# Patient Record
Sex: Male | Born: 1935
Health system: Southern US, Community
[De-identification: ages and names within clinical notes are randomized; demographics above are authoritative.]

## PROBLEM LIST (undated history)

## (undated) DIAGNOSIS — M4800 Spinal stenosis, site unspecified: Secondary | ICD-10-CM

## (undated) DIAGNOSIS — H9190 Unspecified hearing loss, unspecified ear: Secondary | ICD-10-CM

## (undated) DIAGNOSIS — M791 Myalgia, unspecified site: Secondary | ICD-10-CM

## (undated) DIAGNOSIS — E559 Vitamin D deficiency, unspecified: Secondary | ICD-10-CM

## (undated) DIAGNOSIS — N289 Disorder of kidney and ureter, unspecified: Secondary | ICD-10-CM

## (undated) DIAGNOSIS — R000 Tachycardia, unspecified: Secondary | ICD-10-CM

## (undated) DIAGNOSIS — E7800 Pure hypercholesterolemia, unspecified: Secondary | ICD-10-CM

## (undated) DIAGNOSIS — M1990 Unspecified osteoarthritis, unspecified site: Secondary | ICD-10-CM

## (undated) DIAGNOSIS — I3139 Other pericardial effusion (noninflammatory): Secondary | ICD-10-CM

## (undated) DIAGNOSIS — I10 Essential (primary) hypertension: Secondary | ICD-10-CM

## (undated) DIAGNOSIS — I2510 Atherosclerotic heart disease of native coronary artery without angina pectoris: Secondary | ICD-10-CM

## (undated) DIAGNOSIS — I4891 Unspecified atrial fibrillation: Secondary | ICD-10-CM

## (undated) DIAGNOSIS — J309 Allergic rhinitis, unspecified: Secondary | ICD-10-CM

## (undated) DIAGNOSIS — J90 Pleural effusion, not elsewhere classified: Secondary | ICD-10-CM

## (undated) DIAGNOSIS — E871 Hypo-osmolality and hyponatremia: Secondary | ICD-10-CM

## (undated) DIAGNOSIS — I313 Pericardial effusion (noninflammatory): Secondary | ICD-10-CM

## (undated) DIAGNOSIS — H6120 Impacted cerumen, unspecified ear: Secondary | ICD-10-CM

## (undated) DIAGNOSIS — I493 Ventricular premature depolarization: Secondary | ICD-10-CM

## (undated) DIAGNOSIS — I4510 Unspecified right bundle-branch block: Secondary | ICD-10-CM

## (undated) HISTORY — DX: Vitamin D deficiency, unspecified: E55.9

## (undated) HISTORY — DX: Pleural effusion, not elsewhere classified: J90

## (undated) HISTORY — DX: Spinal stenosis, site unspecified: M48.00

## (undated) HISTORY — DX: Pure hypercholesterolemia, unspecified: E78.00

## (undated) HISTORY — PX: CATARACT EXTRACTION: SUR2

## (undated) HISTORY — DX: Impacted cerumen, unspecified ear: H61.20

## (undated) HISTORY — DX: Tachycardia, unspecified: R00.0

## (undated) HISTORY — DX: Myalgia, unspecified site: M79.10

## (undated) HISTORY — PX: JOINT REPLACEMENT: SHX530

## (undated) HISTORY — DX: Unspecified right bundle-branch block: I45.10

## (undated) HISTORY — DX: Ventricular premature depolarization: I49.3

## (undated) HISTORY — PX: CARDIAC CATHETERIZATION: SHX172

## (undated) HISTORY — DX: Hypo-osmolality and hyponatremia: E87.1

## (undated) HISTORY — PX: HERNIA REPAIR: SHX51

## (undated) HISTORY — DX: Unspecified atrial fibrillation: I48.91

## (undated) HISTORY — DX: Other pericardial effusion (noninflammatory): I31.39

## (undated) HISTORY — PX: INNER EAR SURGERY: SHX679

## (undated) HISTORY — DX: Pericardial effusion (noninflammatory): I31.3

## (undated) HISTORY — DX: Disorder of kidney and ureter, unspecified: N28.9

## (undated) HISTORY — DX: Allergic rhinitis, unspecified: J30.9

---

## 1999-04-24 ENCOUNTER — Encounter: Payer: Self-pay | Admitting: Emergency Medicine

## 1999-04-24 ENCOUNTER — Inpatient Hospital Stay (HOSPITAL_COMMUNITY): Admission: EM | Admit: 1999-04-24 | Discharge: 1999-04-26 | Payer: Self-pay | Admitting: Emergency Medicine

## 2000-02-17 ENCOUNTER — Ambulatory Visit (HOSPITAL_COMMUNITY): Admission: RE | Admit: 2000-02-17 | Discharge: 2000-02-17 | Payer: Self-pay | Admitting: Gastroenterology

## 2001-01-02 ENCOUNTER — Encounter: Payer: Self-pay | Admitting: Orthopedic Surgery

## 2001-01-02 ENCOUNTER — Ambulatory Visit: Admission: RE | Admit: 2001-01-02 | Discharge: 2001-01-02 | Payer: Self-pay | Admitting: Orthopedic Surgery

## 2004-04-19 ENCOUNTER — Inpatient Hospital Stay (HOSPITAL_COMMUNITY): Admission: RE | Admit: 2004-04-19 | Discharge: 2004-04-23 | Payer: Self-pay | Admitting: Orthopedic Surgery

## 2004-07-19 ENCOUNTER — Encounter: Admission: RE | Admit: 2004-07-19 | Discharge: 2004-07-19 | Payer: Self-pay | Admitting: Orthopedic Surgery

## 2005-01-18 ENCOUNTER — Encounter: Admission: RE | Admit: 2005-01-18 | Discharge: 2005-01-18 | Payer: Self-pay | Admitting: Orthopedic Surgery

## 2007-10-12 ENCOUNTER — Encounter: Admission: RE | Admit: 2007-10-12 | Discharge: 2007-10-12 | Payer: Self-pay | Admitting: Internal Medicine

## 2007-11-08 ENCOUNTER — Encounter: Admission: RE | Admit: 2007-11-08 | Discharge: 2007-11-08 | Payer: Self-pay | Admitting: Internal Medicine

## 2007-11-22 ENCOUNTER — Encounter: Admission: RE | Admit: 2007-11-22 | Discharge: 2007-11-22 | Payer: Self-pay | Admitting: Internal Medicine

## 2009-03-02 ENCOUNTER — Inpatient Hospital Stay (HOSPITAL_COMMUNITY): Admission: RE | Admit: 2009-03-02 | Discharge: 2009-03-05 | Payer: Self-pay | Admitting: Orthopedic Surgery

## 2010-06-21 LAB — CBC
Platelets: 152 10*3/uL (ref 150–400)
RDW: 14.1 % (ref 11.5–15.5)

## 2010-06-21 LAB — BASIC METABOLIC PANEL
BUN: 14 mg/dL (ref 6–23)
CO2: 28 mEq/L (ref 19–32)
Calcium: 8.2 mg/dL — ABNORMAL LOW (ref 8.4–10.5)
Chloride: 100 mEq/L (ref 96–112)
Creatinine, Ser: 1.13 mg/dL (ref 0.4–1.5)
GFR calc non Af Amer: >60 mL/min (ref >60)
Glucose, Bld: 95 mg/dL (ref 70–99)
Potassium: 4.1 mEq/L (ref 3.5–5.1)

## 2010-06-21 LAB — PROTIME-INR
INR: 1.11 (ref 0.00–1.49)
Prothrombin Time: 14.2 seconds (ref 11.6–15.2)

## 2010-06-22 LAB — BASIC METABOLIC PANEL
BUN: 14 mg/dL (ref 6–23)
Calcium: 8.2 mg/dL — ABNORMAL LOW (ref 8.4–10.5)
Chloride: 97 mEq/L (ref 96–112)
Creatinine, Ser: 0.96 mg/dL (ref 0.4–1.5)
Creatinine, Ser: 1.1 mg/dL (ref 0.4–1.5)
GFR calc non Af Amer: >60 mL/min (ref >60)
Glucose, Bld: 120 mg/dL — ABNORMAL HIGH (ref 70–99)
Potassium: 4.2 mEq/L (ref 3.5–5.1)
Sodium: 131 mEq/L — ABNORMAL LOW (ref 135–145)

## 2010-06-22 LAB — CBC
HCT: 30.3 % — ABNORMAL LOW (ref 39.0–52.0)
HCT: 40.5 % (ref 39.0–52.0)
Hemoglobin: 10.1 g/dL — ABNORMAL LOW (ref 13.0–17.0)
MCHC: 33.9 g/dL (ref 30.0–36.0)
MCV: 101.8 fL — ABNORMAL HIGH (ref 78.0–100.0)
Platelets: 137 10*3/uL — ABNORMAL LOW (ref 150–400)
Platelets: 182 10*3/uL (ref 150–400)
RBC: 2.98 MIL/uL — ABNORMAL LOW (ref 4.22–5.81)
RDW: 14.2 % (ref 11.5–15.5)

## 2010-06-22 LAB — COMPREHENSIVE METABOLIC PANEL
AST: 29 U/L (ref 0–37)
CO2: 30 mEq/L (ref 19–32)
Chloride: 98 mEq/L (ref 96–112)
Creatinine, Ser: 1.17 mg/dL (ref 0.4–1.5)

## 2010-06-22 LAB — URINALYSIS, ROUTINE W REFLEX MICROSCOPIC
Bilirubin Urine: NEGATIVE
Hgb urine dipstick: NEGATIVE
Protein, ur: NEGATIVE mg/dL
Specific Gravity, Urine: 1.016 (ref 1.005–1.030)
Urobilinogen, UA: 0.2 mg/dL (ref 0.0–1.0)

## 2010-06-22 LAB — PROTIME-INR
INR: 0.87 (ref 0.00–1.49)
INR: 1.02 (ref 0.00–1.49)
INR: 1.03 (ref 0.00–1.49)
INR: 1.06 (ref 0.00–1.49)
Prothrombin Time: 13.3 seconds (ref 11.6–15.2)
Prothrombin Time: 13.4 seconds (ref 11.6–15.2)
Prothrombin Time: 13.7 seconds (ref 11.6–15.2)

## 2010-06-22 LAB — APTT: aPTT: 42 seconds — ABNORMAL HIGH (ref 24–37)

## 2010-06-22 LAB — TYPE AND SCREEN
ABO/RH(D): O POS
Antibody Screen: NEGATIVE

## 2010-08-06 NOTE — Cardiovascular Report (Signed)
[place]  Patient:    [name], [name]                    MRN: Proc. Date: 04/26/99 Attending:  Colleen Can. Deborah Chalk, M.D. CC:         Cardiac Catheterization Laboratory             Lum Babe, M.D.                        Cardiac Catheterization  PROCEDURE:  Left heart catheterization with selective coronary angiography, left ventricular angiography with Perclose.  CARDIOLOGIST:  Colleen Can. Deborah Chalk, M.D.  INDICATIONS:  Mr. Brumbaugh is a 77 year old male who presented with atypical symptoms of pallor and nausea, but had positive troponins.  He has a right bundle branch block.  He is referred for a cardiac catheterization.  TYPE AND SITE OF ENTRY:  Percutaneous right femoral artery.  CATHETERS:  A 6-French #4-curved Judkins right and left coronary catheters, a 6-French pigtail ventriculographic catheter.  CONTRAST MATERIAL:  Omnipaque.  MEDICATIONS GIVEN PRIOR TO THE PROCEDURE:  Valium 10 mg p.o.  MEDICATIONS GIVEN DURING THE PROCEDURE:  Versed 2 mg IV.  COMMENTS:  The patient tolerated the procedure well.  ANGIOGRAPHIC DATA: 1. Left main coronary artery:  Is short and large and normal. 2. Left anterior descending coronary artery:  The left anterior descending    bifurcates early with a high diagonal vessel.  After the diagonal, there    appears to be a 40%-50% narrowing at the proximal left anterior descending    and somewhat hazy, and could represent irregular plaque, but in multiple    views it is not felt to be a significant obstructive lesion.  The diagonal    vessel has 20%-30% irregularities.  The left anterior descending coronary    artery wraps around the apex. 3. Left circumflex coronary artery:  The left circumflex is a moderately-    large system.  It continues primarily as an obtuse marginal branch.  It    is free of significant disease. 4. Right coronary artery:  The right coronary artery is a dominant vessel.  There are mild irregularities distally.  LEFT VENTRICULAR ANGIOGRAM:  The left ventricular angiogram is performed in the RAO position.  Overall cardiac size and silhouette are normal.  The left ventricular function is normal.  OVERAlL IMPRESSION: 1. Normal left ventricular function. 2. Mild coronary atherosclerosis with 40%-50% proximal left anterior descending    coronary artery disease and mild disease in the high diagonal vessel.  DISCUSSION:  It is felt that the patient does not need acute intervention at this time but does need to have aggressive management of risk factors, and one cannot exclude the possibility of a ruptured plaque or resolving thrombus.  Aggressive  management and followup nuclear study would be recommended.  DD:  04/26/99 TD:  04/26/99 Job: [id number]

## 2010-08-06 NOTE — Op Note (Signed)
[name], [name]           ACCOUNT NO.:  [id number]   MEDICAL RECORD NO.:  [id number]          PATIENT TYPE:  INP   LOCATION:  0005                         FACILITY:  [facility]   PHYSICIAN:  Ollen Gross, M.D.    DATE OF BIRTH:  1927/03/02   DATE OF PROCEDURE:  04/19/2004  DATE OF DISCHARGE:                                 OPERATIVE REPORT   PREOPERATIVE DIAGNOSIS:  Osteoarthritis, right hip.   POSTOPERATIVE DIAGNOSIS:  Osteoarthritis, right hip.   PROCEDURE:  Right total hip arthroplasty, metal on metal.   SURGEON:  Ollen Gross, M.D.   ASSISTANT:  Alexzandrew L. Julien Girt, P.A.   ANESTHESIA:  Spinal.   ESTIMATED BLOOD LOSS:  400.   DRAINS:  Hemovac x1.   COMPLICATIONS:  None.   CONDITION:  Stable to recovery.   CLINICAL NOTE:  [name] is a 77 year old male with severe end-stage  arthritis of the right hip with intractable pain.  He has failed  nonoperative management and presents now for total hip arthroplasty.   PROCEDURE IN DETAIL:  After successful administration of spinal anesthetic,  the patient was placed in the left lateral decubitus position with his right  side up and held with the hip positioner.  Right lower extremity was  isolated from his perineum with plastic drapes and prepped and draped in the  usual sterile fashion.  A small posterolateral incision is made with a 10  blade through the subcutaneous tissue to the level of the fascia lata which  is incised in line with the skin incision.  Sciatic nerve was palpated and  protected and the short rotator was isolated off the femur.  Capsulectomy  was performed and the hip was dislocated.  The center of the femoral head is  marked and a trial prosthesis placed such that the center of the trial head  corresponds with the center of his native femoral head.  Osteotomy line is  marked on the femoral neck and osteotomy with an oscillating saw.  The femur  was then retracted anteriorly to gain  acetabular exposure.  The acetabular  labrum and a large rim of osteophytes are removed.  Acetabular reaming  starts at 47 coursing in increments of 2-55 and then a 56-mm Pinnacle  acetabular shell is placed in anatomic position and transfixed with two dome  screws.  Trial of 36-mm neutral liner is placed.   The femur is prepared first with a canal finder and then irrigation.  Axial  reaming is performed up to 17.5 mm.  Proximal reaming to a 22-F and the  sleeve machined to a large.  A 22-F large sleeve is placed with a 22 x 17  stem and a 36+8 neck matching his native anteversion.  A 36+0 trial head is  placed and the hip is reduced.  He had excellent stability with full  extension, full external rotation, 70 degrees of flexion, 40 degrees  adduction, 90 degrees internal rotation, and 90 degrees flexion and 90  degrees internal rotation.  The hip is then dislocated and the trial is  removed.  The permanent apex hole eliminator was placed into  the acetabular  shell.  Permanent 36-mm neutral Ultramet metal liner is placed.  It is a  metal-on-metal hip replacement.  The 22-F large sleep is placed with a 22 x  17 stem, 36+8 neck matching his native anteversion.  A 36+0 head is placed  and the hip is reduced to the same stability parameters.  By placing the  right leg on top of the left, the leg lengths are found to be equal.  The  wound is then copiously irrigated with saline solution and a short rotator  is reattached to the femur through drill holes.  Fascia lata is closed over  a Hemovac drain with interrupted #1 Vicryl, subcu closed with #1 and 2-0  Vicryl, and subcuticular with a running 4-0 Monocryl.  The incision is  cleaned and dried and Steri-Strips and a bulky sterile dressing applied.  He  is then awakened and transported to recovery in stable condition.      FA/MEDQ  D:  04/19/2004  T:  04/19/2004  Job:  [id number]

## 2010-08-06 NOTE — Op Note (Signed)
[place]  Patient:    [name]                     MRN: [id number] Proc. Date: 04/24/99 Adm. Date:  [id number] Attending:  Lum Babe CC:         Lum Babe, M.D.             Demetria Pore. Coral Spikes, M.D.                           Operative Report  The patient is a 77 year old right-handed white male chief complaint, "I got clammy and nauseous" admitted with sweating, nausea, clamminess, ? etiology. Initially rule out cardiac, ? other.  Clammy, nausea, and sweat, ? etiology.  About one hour ago at dinner with friends, the patient had his main meal.  He then went to the kitchen and got something else to eat and sat down and then became clammy and nauseated for 15 minutes and felt sweaty, no chest pain.  Dr. Glennon Hamilton, who was at the dinner party, said he looked very pale.  He had no fainting, shortness of breath, chest pain, arm or neck pain.  He had a solid bowel movement but then became slightly loose.  He was brought to the emergency room.  His symptoms resolved before coming to the emergency room.  In the emergency room, temperature is 97, blood pressure 126/69, pulse 63, respiratory rate 20.  EKG: Left anterior hemiblock and right bundle branch block. He was seen by Dr. Ignacia Palma, and I was called to admit to rule out MI.  The patient has not smoked for 40 years.  There is hypertension.  No history of  coronary artery disease.  No history of ulcer disease or pancreatic disease. On Celebrex for hip pain.  No palpitations.  PAST MEDICAL HISTORY:  1. ALLERGIES: INDOCIN and VOLTAREN - rash.  2. MEDICATIONS:    a. Zestril 5 mg a day.    b. Hytrin 10 mg a day.    c. Celebrex 200 mg a day.  IMMUNIZATIONS:  Flu shot, 2000, ? has not had pneumovax shot.  SURGERY: 1. Status post collapsed right ear drum surgery November 2000.  He has tube in    right ear on exam. 2. Left inguinal hernia repair. 3. Status post BCE right  ear.  INJURY: Fracture of right hip with pin that was removed.  He has right hip pain, uses Celebrex.  MEDICAL: 1. 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 2. Status post impetigo. 3. Hypertension. 4. EKG: Right bundle branch block, left anterior hemiblock, December 2000.    No fainting. 5. History of PAC and PVC on Holter. 6. Status post [facility] spotted fever followed by pneumonia. 7. Bifocal glasses.  FAMILY AND SOCIAL HISTORY:  Widowed, lives with a woman now.  He does not smoke, drinks alcohol socially.  Works [employer] part time.   Family history of heart disease over the age of 15.  REVIEW OF SYSTEMS:  The patient has had a headache today, but this has resolved. All other systems negative.  PHYSICAL EXAMINATION:  GENERAL:  Alert and in no acute distress.  VITAL SIGNS:  Temperature 97, blood pressure 126/69, respiratory rate 20, pulse 63.  SKIN:  Not sweaty and no rash.  HEENT:  Eyes: Conjunctivae pink, sclerae white.  Right pupil slightly irregular,  both react to light.  Fundi grossly normal.  ? cataract right eye.  ENT unremarkable.  NECK:  Supple.  Thyroid not enlarged.  No bruit, no adenopathy.  LUNGS:  Clear to percussion and auscultation.  HEART:  Regular rhythm without murmur, gallop, or rub.  No chest wall pain.  ABDOMEN:  Benign, without organomegaly or bruit.  GENITALIA:  Normal male.  RECTAL:  Normal.  Prostate trace to 1+ enlarged.  Stool brown, negative for blood.  EXTREMITIES:  No edema.  Pulses intact.  NEUROLOGIC:  No focal neurological findings.  JOINT:  No synovitis.  DATABASE:  Chest x-ray: No active disease.  EKG: Normal sinus rhythm with left axis deviation and right bundle branch block.  CBC, CMET, CPK-MB, troponin, PT, and PTT pending.  IMPRESSION:  Sweating, nausea, and clammy, ? etiology, rule out cardiac, rule out gastrointestinal, ? other.  PLAN: 1. Admit.   Discuss with patient. 2. Serial enzymes and EKG.  If negative, stress test ?. 3. Monitor for arrhythmia. 4. Lovenox and aspirin. 5. Hypertension.  Continue Hytrin and Zestril. 6. See order sheet for details. DD:  04/24/99 TD:  04/24/99 Job: [id number]

## 2010-08-06 NOTE — Discharge Summary (Signed)
[name], [name]           ACCOUNT NO.:  [id number]   MEDICAL RECORD NO.:  [id number]          PATIENT TYPE:  INP   LOCATION:  0454                         FACILITY[facility]   PHYSICIAN:  Ollen Gross, M.D.    DATE OF BIRTH:  [birth year]   DATE OF ADMISSION:  04/19/2004  DATE OF DISCHARGE:  04/23/2004                                 DISCHARGE SUMMARY   ADMISSION DIAGNOSES:  1.  Osteoarthritis, right hip.  2.  Mild coronary arterial disease.  3.  Hypertension.  4.  Hyperlipidemia.  5.  Allergic rhinitis.  6.  Chronic right bundle branch block with left anterior hemiblock.  7.  Benign prostatic hypertrophy.   DISCHARGE DIAGNOSES:  1.  Osteoarthritis, right hip, status post right total hip arthroplasty.  2.  Mild postoperative blood loss anemia.  3.  Hyponatremia.  4.  Mild coronary artery disease.  5.  Hypertension.  6.  Hyperlipidemia.  7.  Chronic right bundle branch block with left anterior hemiblock.  8.  Benign prostatic hypertrophy.   PROCEDURE:  On April 19, 2004, right total hip arthroplasty with a metal-  on-metal construct.   SURGEON:  Ollen Gross, M.D.   ASSISTANT:  Alexzandrew L. Perkins, P.A.-C.   ANESTHESIA:  Spinal.   ESTIMATED BLOOD LOSS:  400 mL.   DRAINS:  Hemovac x1.   CONSULTATIONS:  Internal medicine, Dr. Johnella Moloney.   BRIEF HISTORY:  Mr. Sollenberger is a 77 year old male with a severe end-stage  arthritis of the right hip with intractable pain.  He has been on  nonoperative management, now presents for a total hip arthroplasty.   LABORATORY DATA:  Preoperative CBC showed a hemoglobin of 14.4, hematocrit  42.4.  Differential within normal limits.  Postoperative hemoglobin 10.9,  came back up to 11.1.  Last H&H was at 10.3 and 29.8.  PT/PTT preoperatively  12 and 43 respectively.  INR 0.8.  Serial pro times are followed. PT/INR  16.8 and 1.5.  Chem panel on admission.  Lower sodium of 132, low chloride  9.3, elevated BUN 29.   Slightly elevated total bilirubin of 1.4.  Remaining  chem panel all within normal limits.  Serum BMETs were followed.  Sodium did  drop down to 120, back up to 128, last noted at 126.  Chloride went down to  88, back up to a normal level of 96.  Glucose went from 87 to 126, back down  to 93.  BUN came down to a normal level from 29.  Last noted to have 12.  Calcium dropped from 9.5 to 7.5, back up to 8.  Urinalysis preoperatively  negative.  Followup urinalysis large blood.  Small leukocyte esterase, only  3-6 white cells but 21-50 red cells.   X-rays:  Pelvis and hip films taken on April 19, 2004, status post right  total hip replacement.  Normal alignment.  Preoperative right hip films on  April 14, 2004 showed severe osteoarthritis, right hip with large cyst  seen in the right femoral head.   HOSPITAL COURSE:  The patient admitted to [facility] and underwent  above procedure without complications.  He tolerated the procedure well,  later returned to recovery room and then orthopedic floor.  Hemovac drain  was placed at the time of surgery, was pulled on day #1.  Was noted  postoperatively to have moderate hyponatremia.  Sodium was repeated.  He was  placed on fluid restrictions, I&Os.  On his I&Os he was noted to be  overloaded but he did have a preoperative BUN which was elevated.  His BUN  had to come back down to a normal level of 22 on day #1.  His creatinine was  also 1.1.  He had fairly good output from that midnight after surgery.  He  was seen later on that day by Dr. Kevan Ny.  His fluids were changed.  It was  noted that his blood pressure was under good control.   By day #2, he was doing better.  He was felt to be a little dehydrated  preoperatively.  Therefore, he did respond to the fluids.  Despite the fact  that he looked positive on his I&Os, his BUN returned to a normal level  after the fluid resuscitation during the surgery.  He started to get up out  of  bed, started ambulating by day #2.  Dressing was changed on day #2.  Incision was healing well.  By day #3, he continued to progress and was  ambulating approximately 80-100 feet.  Hemoglobin was noted to be lower to  10.3 but it was asymptomatic with this and did require transfusion.  He  continued to progress well.  By the following day of April 23, 2004, he  was tolerating medications. Had been weaned over to p.o. medications,  progressing well with physical therapy and it was decided the patient would  be discharged home.   PLAN:  1.  The patient discharged home on April 23, 2004.  2.  Discharge diagnoses:  Please see above.   DISCHARGE MEDICATIONS:  Coumadin, Robaxin, Vicodin.   DIET:  Resume previous home diet.   FOLLOW UP:  Two weeks from surgery.  Call the office for an appointment at  [phone].   DISPOSITION:  Home.   ACTIVITY:  Partial weightbearing 25-50%, right lower extremity.  Home  physical therapy with home health nursing.  May start showering. Total hip  precautions at all times.   CONDITION ON DISCHARGE:  Improved.      ALP/MEDQ  D:  05/26/2004  T:  05/26/2004  Job:  [id number]   cc:   Candyce Churn, M.D.  [address] [place]  [place] [address]  Fax: [phone]

## 2010-08-06 NOTE — H&P (Signed)
[name], [name]           ACCOUNT NO.:  [id number]   MEDICAL RECORD NO.:  [id number]          PATIENT TYPE:  INP   LOCATION:  NA                           FACILITY:  [facility]   PHYSICIAN:  Ollen Gross, M.D.    DATE OF BIRTH:  [birth year]   DATE OF ADMISSION:  04/19/2004  DATE OF DISCHARGE:                                HISTORY & PHYSICAL   CHIEF COMPLAINT:  Right hip pain.   HISTORY OF PRESENT ILLNESS:  The patient is a 77 year old male who has been  seen by Dr. Lequita Halt for ongoing right hip pain.  He was seen back in 2002  and was noted to have some significant arthritic changes in the right hip.  It has progressively gotten worse since that time with significant changes  over the past six months to a year.  The pain is in the groin and lateral  thigh and anterior thigh.  It is worse with activity but he does have some  pain at rest.  He has difficulty getting comfortable at night.  He is at a  stage now where he would like to have something done about it.  It has had a  negative impact on his lifestyle.  He is seen in the office where x-rays  show severe end-stage arthritis of the right hip with bone-on-bone  throughout and some large marginal osteophytes.  The risks and benefits of  the procedure have been discussed with the patient.  It is felt to be the  appropriate candidate.  He is subsequently admitted to the hospital.   ALLERGIES:  Indomethacin, Voltaren, Relafen, ibuprofen and a food allergy to  shell fish.   CURRENT MEDICATIONS:  1.  Lipitor 10 mg.  2.  Terazosin 10 mg.  3.  Diovan/hydrochlorothiazide 160/25 mg.  4.  Norvasc 2.5 mg.  5.  Celebrex 200 mg, stopped.  6.  Baby aspirin, stopped.   PAST MEDICAL HISTORY:  1.  Mild coronary arterial disease.  2.  Hypertension.  3.  Hyperlipidemia.  4.  Allergic rhinitis.  5.  Chronic right bundle branch block with a left anterior hemiblock.  6.  Benign prostatic hypertrophy.  7.  Past history of a syncopal  episode back in March of 2005 secondary to      dehydration.   SOCIAL HISTORY:  1.  Right hip pinning secondary to a fracture in 1968 with subsequent pin      removal.  2.  Hernia repair in 1968.  3.  Eye surgery in the 90's.  4.  Cardiac catheterization about four to five years ago.   SOCIAL HISTORY:  Married, retired, nonsmoker.  His wife will be assisting  with care after surgery.   FAMILY HISTORY:  Father deceased with a history of stroke and prostate  cancer.  Family history is also is significant for heart disease,  hypertension and diabetes.   REVIEW OF SYSTEMS:  GENERAL:  No fevers, chills, night sweats.  NEUROLOGICAL:  No seizure, syncope or paralysis.  RESPIRATORY:  No shortness  of breath, productive cough or hemoptysis.  CARDIOVASCULAR:  No chest pain,  angina or  orthopnea although he does have a history of some mild coronary  arterial disease and hypertension.  GASTROINTESTINAL:  No nausea and  vomiting, diarrhea or constipation.  GENITOURINARY:  No dysuria, hematuria.  He does have some nocturia but no discharge.  MUSCULOSKELETAL:  Right hip as  in the history of present illness.   PHYSICAL EXAMINATION:  VITAL SIGNS:  Pulse 68, respirations 12, blood  pressure 110/72.  GENERAL:  A 77 year old white male, thin-framed, well-nourished, well-  nourished in no acute distress.  He is alert and oriented and cooperative,  very pleasant.  He is accompanied by his wife.  HEENT:  Normocephalic, atraumatic.  Pupils are equal, round and reactive.  EOM's are intact.  He is noted to wear glasses.  NECK:  Supple, no bruits.  CHEST:  Clear anterior and posterior chest wall.  No rhonchi, rubs or  wheezing.  HEART:  A regular rhythm.  No murmurs.  S1 and S2 noted.  ABDOMEN:  Soft, nontender.  Bowel sounds present.  RECTAL/BREASTS/GENITALIA:  Not done, not pertinent to present illness.  EXTREMITIES:  Right hip shows range of motion flexion of 100 degrees,  internal rotation to 5,  external rotation of 20, abduction to 20.  He does  ambulate with an antalgic gait.  Motor function is intact.   IMPRESSION:  1.  Osteoarthritis, right hip.  2.  Mild coronary arterial disease.  3.  Hypertension.  4.  Hyperlipidemia.  5.  Allergic rhinitis.  6.  Chronic right bundle branch block with left anterior hemiblock.  7.  Benign prostatic hypertrophy.   PLAN:  The patient will be admitted to [facility] to undergo right  total hip arthroplasty.  The surgery will be performed by Dr. Ollen Gross.  Dr. Johnella Moloney in the patient's physician.  He will be notified of the  room number and admission and will be consulted if needed for any medical  assistance with the patient throughout the hospital course.     ALP/MEDQ  D:  04/18/2004  T:  04/18/2004  Job:  [id number]   cc:   Candyce Churn, M.D.  [address] [place]  [place] [address]  Fax: [phone]

## 2010-08-06 NOTE — Consult Note (Signed)
[place]  Patient:    [name]                     MRN: [id number] Proc. Date: 04/25/99 Adm. Date:  [id number] Attending:  Lum Babe Dictator:   Colleen Can. Deborah Chalk, M.D. CC:         Lum Babe, M.D.                          Consultation Report  Thank you very much for asking me to see [name].  He is a 77 year old ale admitted with an episode of diaphoresis and nausea after a meal on 04/24/99.  His  first troponin-I was 0.09, but now his second troponin-I is 0.24.  His EKG shows right bundle branch block.  He has not had any recurrent symptoms, but is admitted. He had no chest pain syndrome.  PAST MEDICAL HISTORY: 1. History of hypertension. 2. History of premature atrial contractions. 3. History of premature ventricular contractions. 4. Right bundle branch block first noted in November 2000. 5. He has degenerative joint disease of the hip. 6. Bladder outlet obstruction. 7. History of basal cell carcinoma. 8. History of ear surgery in November 2000. 9. History of hernia surgery.  CURRENT MEDICATIONS: 1. Celebrex, he is off of that here. 2. Lovenox 75 mg q.12h. 3. Ecotrin. 4. Zestril 5 mg q.d. 5. Hytrin 10 mg q.d.  ALLERGIES:  INDOCIN AND VOLTAREN.  FAMILY HISTORY:  Father died at age 79 of a cerebrovascular accident.  Mother died at age 11 of a cerebrovascular accident.  One brother died in his 60s of leukemia.  SOCIAL HISTORY:  He is a widower over the past couple of years.  He is semi-retired from [employer].  He exercises daily and swims.  A nonsmoker, social alcohol.  REVIEW OF SYSTEMS:  He had the episode last night lasting about 15 minutes.  It  occurred after eating.  He felt better after having a bowel movement.  No prior  episodes.  Dr. Glennon Hamilton was present, and insisted that he come to the emergency room.  He did not have any chest discomfort.  PHYSICAL  EXAMINATION:  GENERAL:  He is a pleasant white male in no acute distress.  VITAL SIGNS:  Blood pressure 158/88, heart rate 70, respiratory rate 60.  SKIN:  Warm and dry, color is normal.  LUNGS:  Clear.  HEART:  Regular rate and rhythm.  ABDOMEN:  Soft, nontender.  EXTREMITIES:  Without edema.  Good peripheral pulses.  NEUROLOGIC:  He is intact.  LABORATORY DATA:  His white blood cell count is 10,800, hematocrit 42, platelet  count is 129,000.  Potassium is 5.6, BUN 35, creatinine 1.2, glucose 106. CK-MBs remained negative, but with positive troponins after having a negative initial troponin.  His EKG shows a right bundle branch block.  OVERALL IMPRESSION: 1. Episode of diaphoresis, pallor, nausea, with subsequent increased troponin. 2. Hypertension. 3. Right bundle branch block. 4. History of premature atrial contractions, premature ventricular contractions.  PLAN:  In light of the increased troponin and symptoms, we will proceed on with  cardiac catheterization.  Procedure, risks, and benefits have been explained. Risks, including heart attack, stroke, heart stopping, allergy, infection, bleeding, have all been explained.  The patient is willing to proceed. DD:  04/25/99 TD:  04/25/99 Job: [id number]

## 2010-08-06 NOTE — Procedures (Signed)
[facility]  Patient:    [name], [name]                    MRN: [id number] Proc. Date: 02/17/00 Attending:  Verlin Grills, M.D. CC:         Pearla Dubonnet, M.D.   Procedure Report  PROCEDURES:  Surveillance colonoscopy.  PROCEDURE INDICATIONS:  Mr. Jaydien Panepinto (date of birth [birth year]) is a 77 year old male who is due for surveillance colonoscopy to prevent colon cancer.  I discussed with Mr. Jansson the complications associated with colonoscopy and polypectomy, including a 1:1000 risk of bleeding and for 4:1000 risk of colon rupture requiring surgery.  Mr. Deakin has signed the operative permit.  ENDOSCOPIST:  Verlin Grills, M.D.  PREMEDICATION:  Demerol 30 mg and Versed 5 mg.  ENDOSCOPE:  Olympus pediatric colonoscope.  DESCRIPTION OF PROCEDURE:  After obtaining informed consent, the patient was placed in the left lateral decubitus position.  I administered intravenous Demerol and intravenous Versed to achieve conscious sedation for the procedure.  The patients blood pressure, oxygen saturation, and cardiac rhythm were monitored throughout the procedure and documented in the medical record.  Anal inspection was normal.  Digital rectal exam revealed a non-nodular, but enlarged prostate.  The Olympus pediatric video colonoscope was introduced into the rectum and under direct vision advanced to the cecum as identified by normal-appearing ileocecal valve.  Colonic preparation for the exam today was excellent.  Rectal normal.  Sigmoid colon and descending colon normal.  Splenic flexure normal.  Transverse colon normal.  Hepatic flexure normal.  Ascending colon normal.  Cecum and ileocecal valve normal.  ASSESSMENT:  Normal proctocolonoscopy to the cecum.  No endoscopic evidence for the presence of colorectal neoplasia.  RECOMMENDATIONS:  Repeat colonoscopy in 5-10 years. DD:  02/17/00 TD:   02/17/00 Job: [id number]

## 2010-08-06 NOTE — Discharge Summary (Signed)
[facility]  Patient:    [name]                     MRN: [id number] Adm. Date:  [id number] Disc. Date: [id number] Attending:  Lum Babe CC:         Colleen Can. Deborah Chalk, M.D.                           Discharge Summary  A 77 year old white male admitted after an episode of nausea and becoming clammy while at dinner.  He underwent cardiac catheterization by Dr. Roger Shelter. The catheterization revealed normal left ventricular function, a 40 to 50% proximal LAD lesion.  This area was hazy, and the question of irregular plaque/clot was raised. The diagonal vessel had 28 to 30% irregularities.  There was occlusion of an obtuse marginal branch not noted on the initial review.  The patient was discharged from the hospital on April 26, 1999.  LABORATORY DATA:  Hemoglobin 14.9 g percent, hematocrit 42.9%, white count 10,800 which subsequently fell to 4000.  Platelets were low running from 117,000 to 131,000.  Initial differential showed 89 polys, 4 lymphs, 4 monocytes, 2 eosinophil, no basophils, 1 large unidentified cell.  PT and PTT were normal on  admission.  Sodium 133, potassium 5.6 on a hemolyzed specimen.  Chloride 102, CO2 24, glucose 106, BUN 35, creatinine 1.2.  Repeat sodium 137, potassium 4.0, chloride 107, CO2 28, BUN 14, creatinine 1.0.  AST was 41 with normal to 37. Bilirubin was 1.5, normal to 1.0.  CK: First 139, second 94, MB 3.9.  Troponin  was 0.09 which at that time was a minimally elevated value.  Repeat troponin was 0.24 which is indeed elevated, but additional CKs were 90, 71, with MB of 3.2 and 2.2.  Electrocardiogram showed sinus rhythm with left axis deviation, right bundle branch block.  COURSE IN THE HOSPITAL:  The patient was admitted to rule out infarction.  He was seen in consultation by Dr. Deborah Chalk.  With the elevated troponin I but normal CKs, it was elected to go ahead and do cardiac  catheterization with findings indicated above.  After the procedure, it was elected to follow the patient clinically. he possibility of ruptured plaque of resolving thrombus was considered.  Plans were made for outpatient nuclear study.  He was discharged from the hospital on April 26, 1999.  DISCHARGE DIAGNOSES: 1. Coronary artery disease, mild. 2. Hypertension.  DISCHARGE MEDICATIONS: 1. Plavix 75 mg 1 a day for a month. 2. Imdur 30 mg 1 a day for a month. 3. Aspirin 325 mg 1 daily. 4. Zestril 5 mg a day. 5. Hytrin 10 mg a day. 6. Celebrex 200 mg 1 a day.  ACTIVITY:  Temporary restriction on swimming until nuclear study is done.  DIET:  Low fat.  WOUND CARE:  Not applicable.  SPECIAL INSTRUCTIONS:  Leave bandage on until the next day, plain Neosporin for two days.  May bathe the day after dressing is removed.  FOLLOWUP:  Follow up with physician in 10 days.  CONDITION ON DISCHARGE:  Improved. DD:  05/21/99 TD:  05/22/99 Job: [id number]

## 2011-04-13 DIAGNOSIS — N433 Hydrocele, unspecified: Secondary | ICD-10-CM | POA: Diagnosis not present

## 2011-04-13 DIAGNOSIS — N401 Enlarged prostate with lower urinary tract symptoms: Secondary | ICD-10-CM | POA: Diagnosis not present

## 2011-04-13 DIAGNOSIS — N509 Disorder of male genital organs, unspecified: Secondary | ICD-10-CM | POA: Diagnosis not present

## 2011-05-19 DIAGNOSIS — R002 Palpitations: Secondary | ICD-10-CM | POA: Diagnosis not present

## 2011-06-10 DIAGNOSIS — M169 Osteoarthritis of hip, unspecified: Secondary | ICD-10-CM | POA: Diagnosis not present

## 2011-06-10 DIAGNOSIS — M1610 Unilateral primary osteoarthritis, unspecified hip: Secondary | ICD-10-CM | POA: Diagnosis not present

## 2011-06-13 DIAGNOSIS — E782 Mixed hyperlipidemia: Secondary | ICD-10-CM | POA: Diagnosis not present

## 2011-06-13 DIAGNOSIS — R002 Palpitations: Secondary | ICD-10-CM | POA: Diagnosis not present

## 2011-06-13 DIAGNOSIS — M169 Osteoarthritis of hip, unspecified: Secondary | ICD-10-CM | POA: Diagnosis not present

## 2011-06-13 DIAGNOSIS — M1610 Unilateral primary osteoarthritis, unspecified hip: Secondary | ICD-10-CM | POA: Diagnosis not present

## 2011-07-11 DIAGNOSIS — R002 Palpitations: Secondary | ICD-10-CM | POA: Diagnosis not present

## 2011-08-24 DIAGNOSIS — L570 Actinic keratosis: Secondary | ICD-10-CM | POA: Diagnosis not present

## 2011-09-26 DIAGNOSIS — Z79899 Other long term (current) drug therapy: Secondary | ICD-10-CM | POA: Diagnosis not present

## 2011-09-26 DIAGNOSIS — E782 Mixed hyperlipidemia: Secondary | ICD-10-CM | POA: Diagnosis not present

## 2011-09-26 DIAGNOSIS — Z0000 Encounter for general adult medical examination without abnormal findings: Secondary | ICD-10-CM | POA: Diagnosis not present

## 2011-09-26 DIAGNOSIS — I10 Essential (primary) hypertension: Secondary | ICD-10-CM | POA: Diagnosis not present

## 2011-09-26 DIAGNOSIS — Z125 Encounter for screening for malignant neoplasm of prostate: Secondary | ICD-10-CM | POA: Diagnosis not present

## 2011-09-27 DIAGNOSIS — Z1331 Encounter for screening for depression: Secondary | ICD-10-CM | POA: Diagnosis not present

## 2011-09-27 DIAGNOSIS — M1610 Unilateral primary osteoarthritis, unspecified hip: Secondary | ICD-10-CM | POA: Diagnosis not present

## 2011-09-27 DIAGNOSIS — M169 Osteoarthritis of hip, unspecified: Secondary | ICD-10-CM | POA: Diagnosis not present

## 2011-09-27 DIAGNOSIS — E559 Vitamin D deficiency, unspecified: Secondary | ICD-10-CM | POA: Diagnosis not present

## 2011-09-27 DIAGNOSIS — G4700 Insomnia, unspecified: Secondary | ICD-10-CM | POA: Diagnosis not present

## 2011-09-27 DIAGNOSIS — N400 Enlarged prostate without lower urinary tract symptoms: Secondary | ICD-10-CM | POA: Diagnosis not present

## 2011-09-27 DIAGNOSIS — E782 Mixed hyperlipidemia: Secondary | ICD-10-CM | POA: Diagnosis not present

## 2011-09-27 DIAGNOSIS — I10 Essential (primary) hypertension: Secondary | ICD-10-CM | POA: Diagnosis not present

## 2011-09-27 DIAGNOSIS — Z0000 Encounter for general adult medical examination without abnormal findings: Secondary | ICD-10-CM | POA: Diagnosis not present

## 2011-10-05 DIAGNOSIS — L570 Actinic keratosis: Secondary | ICD-10-CM | POA: Diagnosis not present

## 2011-10-12 DIAGNOSIS — N183 Chronic kidney disease, stage 3 unspecified: Secondary | ICD-10-CM | POA: Diagnosis not present

## 2011-11-25 DIAGNOSIS — L570 Actinic keratosis: Secondary | ICD-10-CM | POA: Diagnosis not present

## 2011-12-12 DIAGNOSIS — Z23 Encounter for immunization: Secondary | ICD-10-CM | POA: Diagnosis not present

## 2011-12-12 DIAGNOSIS — L570 Actinic keratosis: Secondary | ICD-10-CM | POA: Diagnosis not present

## 2012-01-05 DIAGNOSIS — Z961 Presence of intraocular lens: Secondary | ICD-10-CM | POA: Diagnosis not present

## 2012-02-02 DIAGNOSIS — M1610 Unilateral primary osteoarthritis, unspecified hip: Secondary | ICD-10-CM | POA: Diagnosis not present

## 2012-02-02 DIAGNOSIS — M76899 Other specified enthesopathies of unspecified lower limb, excluding foot: Secondary | ICD-10-CM | POA: Diagnosis not present

## 2012-02-02 DIAGNOSIS — M169 Osteoarthritis of hip, unspecified: Secondary | ICD-10-CM | POA: Diagnosis not present

## 2012-02-03 ENCOUNTER — Other Ambulatory Visit (HOSPITAL_COMMUNITY): Payer: Self-pay | Admitting: Orthopedic Surgery

## 2012-02-03 DIAGNOSIS — M25552 Pain in left hip: Secondary | ICD-10-CM

## 2012-02-03 DIAGNOSIS — M25551 Pain in right hip: Secondary | ICD-10-CM

## 2012-02-06 DIAGNOSIS — B353 Tinea pedis: Secondary | ICD-10-CM | POA: Diagnosis not present

## 2012-02-06 DIAGNOSIS — L820 Inflamed seborrheic keratosis: Secondary | ICD-10-CM | POA: Diagnosis not present

## 2012-02-06 DIAGNOSIS — D485 Neoplasm of uncertain behavior of skin: Secondary | ICD-10-CM | POA: Diagnosis not present

## 2012-02-29 DIAGNOSIS — H7419 Adhesive middle ear disease, unspecified ear: Secondary | ICD-10-CM | POA: Diagnosis not present

## 2012-02-29 DIAGNOSIS — H903 Sensorineural hearing loss, bilateral: Secondary | ICD-10-CM | POA: Diagnosis not present

## 2012-02-29 DIAGNOSIS — H6980 Other specified disorders of Eustachian tube, unspecified ear: Secondary | ICD-10-CM | POA: Diagnosis not present

## 2012-02-29 DIAGNOSIS — H905 Unspecified sensorineural hearing loss: Secondary | ICD-10-CM | POA: Diagnosis not present

## 2012-03-01 ENCOUNTER — Encounter (HOSPITAL_COMMUNITY)
Admission: RE | Admit: 2012-03-01 | Discharge: 2012-03-01 | Disposition: A | Discharge status: Home / Self Care | Payer: Medicare Other | Source: Ambulatory Visit | Attending: Orthopedic Surgery | Admitting: Orthopedic Surgery

## 2012-03-01 ENCOUNTER — Ambulatory Visit (HOSPITAL_COMMUNITY)
Admission: RE | Admit: 2012-03-01 | Discharge: 2012-03-01 | Disposition: A | Discharge status: Home / Self Care | Payer: Medicare Other | Source: Ambulatory Visit | Attending: Orthopedic Surgery | Admitting: Orthopedic Surgery

## 2012-03-01 DIAGNOSIS — M25559 Pain in unspecified hip: Secondary | ICD-10-CM | POA: Diagnosis not present

## 2012-03-01 DIAGNOSIS — Z96649 Presence of unspecified artificial hip joint: Secondary | ICD-10-CM | POA: Diagnosis not present

## 2012-03-01 DIAGNOSIS — M25552 Pain in left hip: Secondary | ICD-10-CM

## 2012-03-01 DIAGNOSIS — M25551 Pain in right hip: Secondary | ICD-10-CM

## 2012-03-01 MED ORDER — TECHNETIUM TC 99M MEDRONATE IV KIT
24.0000 | PACK | Freq: Once | INTRAVENOUS | Status: AC | PRN
  Administered 2012-03-01: 24 via INTRAVENOUS

## 2012-04-05 DIAGNOSIS — M1610 Unilateral primary osteoarthritis, unspecified hip: Secondary | ICD-10-CM | POA: Diagnosis not present

## 2012-04-05 DIAGNOSIS — M169 Osteoarthritis of hip, unspecified: Secondary | ICD-10-CM | POA: Diagnosis not present

## 2012-04-11 DIAGNOSIS — E559 Vitamin D deficiency, unspecified: Secondary | ICD-10-CM | POA: Diagnosis not present

## 2012-04-11 DIAGNOSIS — I10 Essential (primary) hypertension: Secondary | ICD-10-CM | POA: Diagnosis not present

## 2012-04-11 DIAGNOSIS — M169 Osteoarthritis of hip, unspecified: Secondary | ICD-10-CM | POA: Diagnosis not present

## 2012-04-11 DIAGNOSIS — N400 Enlarged prostate without lower urinary tract symptoms: Secondary | ICD-10-CM | POA: Diagnosis not present

## 2012-04-11 DIAGNOSIS — G4700 Insomnia, unspecified: Secondary | ICD-10-CM | POA: Diagnosis not present

## 2012-04-11 DIAGNOSIS — M1610 Unilateral primary osteoarthritis, unspecified hip: Secondary | ICD-10-CM | POA: Diagnosis not present

## 2012-04-11 DIAGNOSIS — R6889 Other general symptoms and signs: Secondary | ICD-10-CM | POA: Diagnosis not present

## 2012-04-11 DIAGNOSIS — N183 Chronic kidney disease, stage 3 unspecified: Secondary | ICD-10-CM | POA: Diagnosis not present

## 2012-04-11 DIAGNOSIS — E782 Mixed hyperlipidemia: Secondary | ICD-10-CM | POA: Diagnosis not present

## 2012-04-13 DIAGNOSIS — N401 Enlarged prostate with lower urinary tract symptoms: Secondary | ICD-10-CM | POA: Diagnosis not present

## 2012-05-14 DIAGNOSIS — R0609 Other forms of dyspnea: Secondary | ICD-10-CM | POA: Diagnosis not present

## 2012-05-14 DIAGNOSIS — I498 Other specified cardiac arrhythmias: Secondary | ICD-10-CM | POA: Diagnosis not present

## 2012-05-14 DIAGNOSIS — I452 Bifascicular block: Secondary | ICD-10-CM | POA: Diagnosis not present

## 2012-05-14 DIAGNOSIS — R0989 Other specified symptoms and signs involving the circulatory and respiratory systems: Secondary | ICD-10-CM | POA: Diagnosis not present

## 2012-05-14 DIAGNOSIS — R5381 Other malaise: Secondary | ICD-10-CM | POA: Diagnosis not present

## 2012-05-14 DIAGNOSIS — R5383 Other fatigue: Secondary | ICD-10-CM | POA: Diagnosis not present

## 2012-05-14 DIAGNOSIS — I2510 Atherosclerotic heart disease of native coronary artery without angina pectoris: Secondary | ICD-10-CM | POA: Diagnosis not present

## 2012-05-15 DIAGNOSIS — M48061 Spinal stenosis, lumbar region without neurogenic claudication: Secondary | ICD-10-CM | POA: Diagnosis not present

## 2012-05-17 DIAGNOSIS — R5381 Other malaise: Secondary | ICD-10-CM | POA: Diagnosis not present

## 2012-05-17 DIAGNOSIS — R0609 Other forms of dyspnea: Secondary | ICD-10-CM | POA: Diagnosis not present

## 2012-05-17 DIAGNOSIS — I498 Other specified cardiac arrhythmias: Secondary | ICD-10-CM | POA: Diagnosis not present

## 2012-05-17 DIAGNOSIS — I452 Bifascicular block: Secondary | ICD-10-CM | POA: Diagnosis not present

## 2012-05-17 DIAGNOSIS — R5383 Other fatigue: Secondary | ICD-10-CM | POA: Diagnosis not present

## 2012-05-17 DIAGNOSIS — I2510 Atherosclerotic heart disease of native coronary artery without angina pectoris: Secondary | ICD-10-CM | POA: Diagnosis not present

## 2012-05-18 DIAGNOSIS — M4800 Spinal stenosis, site unspecified: Secondary | ICD-10-CM | POA: Diagnosis not present

## 2012-05-18 DIAGNOSIS — I2510 Atherosclerotic heart disease of native coronary artery without angina pectoris: Secondary | ICD-10-CM | POA: Diagnosis not present

## 2012-05-18 DIAGNOSIS — I10 Essential (primary) hypertension: Secondary | ICD-10-CM | POA: Diagnosis not present

## 2012-05-18 DIAGNOSIS — R9439 Abnormal result of other cardiovascular function study: Secondary | ICD-10-CM | POA: Diagnosis not present

## 2012-05-21 DIAGNOSIS — I4510 Unspecified right bundle-branch block: Secondary | ICD-10-CM | POA: Diagnosis not present

## 2012-05-22 DIAGNOSIS — R5381 Other malaise: Secondary | ICD-10-CM | POA: Diagnosis not present

## 2012-06-27 DIAGNOSIS — M545 Low back pain, unspecified: Secondary | ICD-10-CM | POA: Diagnosis not present

## 2012-06-27 DIAGNOSIS — M5126 Other intervertebral disc displacement, lumbar region: Secondary | ICD-10-CM | POA: Diagnosis not present

## 2012-06-27 DIAGNOSIS — M5137 Other intervertebral disc degeneration, lumbosacral region: Secondary | ICD-10-CM | POA: Diagnosis not present

## 2012-06-27 DIAGNOSIS — M47817 Spondylosis without myelopathy or radiculopathy, lumbosacral region: Secondary | ICD-10-CM | POA: Diagnosis not present

## 2012-06-27 DIAGNOSIS — M48061 Spinal stenosis, lumbar region without neurogenic claudication: Secondary | ICD-10-CM | POA: Diagnosis not present

## 2012-07-03 ENCOUNTER — Other Ambulatory Visit: Payer: Self-pay | Admitting: Neurological Surgery

## 2012-07-03 DIAGNOSIS — M48061 Spinal stenosis, lumbar region without neurogenic claudication: Secondary | ICD-10-CM | POA: Diagnosis not present

## 2012-08-02 ENCOUNTER — Encounter (HOSPITAL_COMMUNITY): Payer: Self-pay | Admitting: Pharmacist

## 2012-08-03 ENCOUNTER — Encounter (HOSPITAL_COMMUNITY): Payer: Self-pay

## 2012-08-03 ENCOUNTER — Inpatient Hospital Stay (HOSPITAL_COMMUNITY): Admission: RE | Admit: 2012-08-03 | Payer: Medicare Other | Source: Ambulatory Visit

## 2012-08-03 ENCOUNTER — Encounter (HOSPITAL_COMMUNITY)
Admission: RE | Admit: 2012-08-03 | Discharge: 2012-08-03 | Disposition: A | Discharge status: Home / Self Care | Payer: Medicare Other | Source: Ambulatory Visit | Attending: Neurological Surgery | Admitting: Neurological Surgery

## 2012-08-03 DIAGNOSIS — M545 Low back pain, unspecified: Secondary | ICD-10-CM | POA: Diagnosis not present

## 2012-08-03 DIAGNOSIS — I10 Essential (primary) hypertension: Secondary | ICD-10-CM | POA: Diagnosis present

## 2012-08-03 DIAGNOSIS — I2510 Atherosclerotic heart disease of native coronary artery without angina pectoris: Secondary | ICD-10-CM | POA: Diagnosis present

## 2012-08-03 DIAGNOSIS — M4800 Spinal stenosis, site unspecified: Secondary | ICD-10-CM | POA: Diagnosis not present

## 2012-08-03 DIAGNOSIS — M48061 Spinal stenosis, lumbar region without neurogenic claudication: Principal | ICD-10-CM | POA: Diagnosis present

## 2012-08-03 DIAGNOSIS — M129 Arthropathy, unspecified: Secondary | ICD-10-CM | POA: Diagnosis present

## 2012-08-03 HISTORY — DX: Atherosclerotic heart disease of native coronary artery without angina pectoris: I25.10

## 2012-08-03 HISTORY — DX: Unspecified osteoarthritis, unspecified site: M19.90

## 2012-08-03 HISTORY — DX: Unspecified hearing loss, unspecified ear: H91.90

## 2012-08-03 HISTORY — DX: Essential (primary) hypertension: I10

## 2012-08-03 LAB — BASIC METABOLIC PANEL
CO2: 29 mEq/L (ref 19–32)
GFR calc non Af Amer: 51 mL/min — ABNORMAL LOW (ref >90)
Glucose, Bld: 87 mg/dL (ref 70–99)
Potassium: 4.2 mEq/L (ref 3.5–5.1)
Sodium: 137 mEq/L (ref 135–145)

## 2012-08-03 LAB — CBC WITH DIFFERENTIAL/PLATELET
Eosinophils Absolute: 0.2 10*3/uL (ref 0.0–0.7)
Hemoglobin: 13.4 g/dL (ref 13.0–17.0)
Lymphs Abs: 1.7 10*3/uL (ref 0.7–4.0)
MCH: 33.3 pg (ref 26.0–34.0)
Monocytes Relative: 9 % (ref 3–12)
Neutrophils Relative %: 66 % (ref 43–77)
RBC: 4.02 MIL/uL — ABNORMAL LOW (ref 4.22–5.81)

## 2012-08-03 NOTE — Pre-Procedure Instructions (Signed)
[name]  08/03/2012   Your procedure is scheduled on:  08-08-2012   Wednesday   Report to [facility] at 6:30 AM.  Call this number if you have problems the morning of surgery: [phone]   Remember:   Do not eat food or drink liquids after midnight.   Take these medicines the morning of surgery with A SIP OF WATER: none   Do not wear jewelry, Do not wear lotions, powders, or perfumes .   Do not shave 48 hours prior to surgery. Men may shave face and neck.  Do not bring valuables to the hospital.  Contacts, dentures or bridgework may not be worn into surgery.  Leave suitcase in the car. After surgery it may be brought to your room.   For patients admitted to the hospital, checkout time is 11:00 AM the day ofdischarge.   Patients discharged the day of surgery will not be allowed to drive home.   Name and phone number of your driver:     Special Instructions: Shower using CHG 2 nights before surgery and the night before surgery.  If you shower the day of surgery use CHG.  Use special wash - you have one bottle of CHG for all showers.  You should use approximately 1/3 of the bottle for each shower.   Please read over the following fact sheets that you were given: Pain Booklet, Coughing and Deep Breathing and Surgical Site Infection Prevention

## 2012-08-07 ENCOUNTER — Encounter (HOSPITAL_COMMUNITY): Payer: Self-pay

## 2012-08-07 MED ORDER — CEFAZOLIN SODIUM-DEXTROSE 2-3 GM-% IV SOLR
2.0000 g | INTRAVENOUS | Status: AC
  Administered 2012-08-08: 2 g via INTRAVENOUS
  Filled 2012-08-07: qty 50

## 2012-08-07 MED ORDER — DEXAMETHASONE SODIUM PHOSPHATE 10 MG/ML IJ SOLN
10.0000 mg | INTRAMUSCULAR | Status: AC
  Administered 2012-08-08: 10 mg via INTRAVENOUS
  Filled 2012-08-07: qty 1

## 2012-08-07 NOTE — Progress Notes (Addendum)
Anesthesia chart review: Patient is an 79 year old male scheduled for L4-5, L5-S1 lumbar decompression by [name] on 08/08/2012. History includes nonsmoker, mild CAD by 2001 cath, hypertension, arthritis, decreased hearing, bilateral THR.  PCP is [name].  He was evaluated by cardiologist [name] in February of this year for symptomatic bradycardia and fatigue.  By notes he had a cardiac cath in 2001 that showed 40-50% proximal LAD, 20-30% diagonal, normal RCA and LCX, normal LV function.  He ordered a nuclear stress test that was done on 05/17/12 that showed evidence of possible mild ischemia in the basal inferolateral, mid anterolateral and apical lateral regions.  The defect is mostly fixed and most likely consistent with diaphragmatic attenuation.  No regional wall motion abnormalities.  Post-stress EF 60%.  According to [name]' 05/18/12 office note, he felt the stress test was overall low risk.  [name] did plan to pursue angiogram unless [name] developed active chest pain.        EKG on 04/11/12 showed SB @ 44 bpm, right BBB, LAFB, bifascicular block.  He will need a CXR on the day of surgery.  Preoperative labs noted.    I reviewed cardiac records with anesthesiologist [name].  Since patient had a recent mildly abnormal stress test, he agrees it would be best to get further input from [name] prior to surgery.  I contacted [facility] and was told that [name] was just left for the day and could only leave a message with his nurse.  I contacted [name] at [name]' office.  She will review with [name] and see if he is able to get additional input from [name] prior to planned surgery.  [name] [facility] Stay [facility]/Anesthesiology Phone [phone] 08/07/2012 11:20 AM  Addendum: 08/07/12 1340 I received a call back from [name]' nurse [name] earlier today.  She was able to speak with him over the telephone about patient's  upcoming surgery.  Apparently, patient was seen recently (08/03/12) by his PCP Dr. Kevan Ny for a pre-operative evaluation.  In that note, patient reported the ability to "walk up and down a flight of stairs without significant shortness of breath or chest pain or marked fatigue."  [name] ultimately cleared patient for surgery from his standpoint.  [name] reviewed this note with [name], who said that unless patient had a change in his symptomology then he would not change his previous recommendations from February 2014.  Patient will be evaluated by his assigned anesthesiologist on the day of surgery, and if no new CV symptoms then would anticipate that he could proceed as planned. I updated [name] at [name]' office.

## 2012-08-08 ENCOUNTER — Encounter (HOSPITAL_COMMUNITY)
Admission: RE | Disposition: A | Discharge status: Home / Self Care | Payer: Self-pay | Source: Ambulatory Visit | Attending: Neurological Surgery

## 2012-08-08 ENCOUNTER — Inpatient Hospital Stay (HOSPITAL_COMMUNITY): Payer: Medicare Other

## 2012-08-08 ENCOUNTER — Inpatient Hospital Stay (HOSPITAL_COMMUNITY)
Admission: RE | Admit: 2012-08-08 | Discharge: 2012-08-09 | DRG: 491 | Disposition: A | Discharge status: Home / Self Care | Payer: Medicare Other | Source: Ambulatory Visit | Attending: Neurological Surgery | Admitting: Neurological Surgery

## 2012-08-08 ENCOUNTER — Inpatient Hospital Stay (HOSPITAL_COMMUNITY): Payer: Medicare Other | Admitting: Anesthesiology

## 2012-08-08 ENCOUNTER — Encounter (HOSPITAL_COMMUNITY): Payer: Self-pay | Admitting: Vascular Surgery

## 2012-08-08 ENCOUNTER — Encounter (HOSPITAL_COMMUNITY): Payer: Self-pay | Admitting: Neurological Surgery

## 2012-08-08 DIAGNOSIS — Z01811 Encounter for preprocedural respiratory examination: Secondary | ICD-10-CM | POA: Diagnosis not present

## 2012-08-08 DIAGNOSIS — I10 Essential (primary) hypertension: Secondary | ICD-10-CM | POA: Diagnosis not present

## 2012-08-08 DIAGNOSIS — Z9889 Other specified postprocedural states: Secondary | ICD-10-CM

## 2012-08-08 DIAGNOSIS — M48061 Spinal stenosis, lumbar region without neurogenic claudication: Secondary | ICD-10-CM | POA: Diagnosis not present

## 2012-08-08 DIAGNOSIS — M519 Unspecified thoracic, thoracolumbar and lumbosacral intervertebral disc disorder: Secondary | ICD-10-CM | POA: Diagnosis not present

## 2012-08-08 DIAGNOSIS — I2510 Atherosclerotic heart disease of native coronary artery without angina pectoris: Secondary | ICD-10-CM | POA: Diagnosis not present

## 2012-08-08 DIAGNOSIS — M129 Arthropathy, unspecified: Secondary | ICD-10-CM | POA: Diagnosis not present

## 2012-08-08 DIAGNOSIS — M5137 Other intervertebral disc degeneration, lumbosacral region: Secondary | ICD-10-CM | POA: Diagnosis not present

## 2012-08-08 HISTORY — PX: LUMBAR LAMINECTOMY/DECOMPRESSION MICRODISCECTOMY: SHX5026

## 2012-08-08 SURGERY — LUMBAR LAMINECTOMY/DECOMPRESSION MICRODISCECTOMY 2 LEVELS
Anesthesia: General | Site: Back | Wound class: Clean

## 2012-08-08 MED ORDER — SODIUM CHLORIDE 0.9 % IV SOLN
INTRAVENOUS | Status: AC
  Filled 2012-08-08: qty 500

## 2012-08-08 MED ORDER — PHENOL 1.4 % MT LIQD: 1.0000 | OROMUCOSAL | Status: DC | PRN

## 2012-08-08 MED ORDER — HYDROMORPHONE HCL PF 1 MG/ML IJ SOLN
0.2500 mg | INTRAMUSCULAR | Status: DC | PRN
  Administered 2012-08-08 (×2): 0.5 mg via INTRAVENOUS

## 2012-08-08 MED ORDER — BUPIVACAINE HCL (PF) 0.25 % IJ SOLN
INTRAMUSCULAR | Status: DC | PRN
  Administered 2012-08-08: 3 mL

## 2012-08-08 MED ORDER — FENTANYL CITRATE 0.05 MG/ML IJ SOLN
INTRAMUSCULAR | Status: DC | PRN
  Administered 2012-08-08 (×3): 50 ug via INTRAVENOUS

## 2012-08-08 MED ORDER — HYDROCHLOROTHIAZIDE 12.5 MG PO CAPS
12.5000 mg | ORAL_CAPSULE | Freq: Every day | ORAL | Status: DC
  Administered 2012-08-08 – 2012-08-09 (×2): 12.5 mg via ORAL
  Filled 2012-08-08 (×2): qty 1

## 2012-08-08 MED ORDER — TRAMADOL HCL 50 MG PO TABS
50.0000 mg | ORAL_TABLET | Freq: Four times a day (QID) | ORAL | Status: DC | PRN
  Filled 2012-08-08: qty 2

## 2012-08-08 MED ORDER — ASPIRIN EC 81 MG PO TBEC
162.0000 mg | DELAYED_RELEASE_TABLET | Freq: Every day | ORAL | Status: DC
  Administered 2012-08-08 – 2012-08-09 (×2): 162 mg via ORAL
  Filled 2012-08-08 (×2): qty 2

## 2012-08-08 MED ORDER — MORPHINE SULFATE 2 MG/ML IJ SOLN: 1.0000 mg | INTRAMUSCULAR | Status: DC | PRN

## 2012-08-08 MED ORDER — ONDANSETRON HCL 4 MG/2ML IJ SOLN: 4.0000 mg | INTRAMUSCULAR | Status: DC | PRN

## 2012-08-08 MED ORDER — MUPIROCIN 2 % EX OINT: TOPICAL_OINTMENT | Freq: Two times a day (BID) | CUTANEOUS | Status: DC

## 2012-08-08 MED ORDER — MENTHOL 3 MG MT LOZG: 1.0000 | LOZENGE | OROMUCOSAL | Status: DC | PRN

## 2012-08-08 MED ORDER — PROPOFOL 10 MG/ML IV BOLUS
INTRAVENOUS | Status: DC | PRN
  Administered 2012-08-08: 190 mg via INTRAVENOUS

## 2012-08-08 MED ORDER — SODIUM CHLORIDE 0.9 % IR SOLN
Status: DC | PRN
  Administered 2012-08-08: 09:00:00

## 2012-08-08 MED ORDER — VALSARTAN-HYDROCHLOROTHIAZIDE 160-25 MG PO TABS
0.5000 | ORAL_TABLET | Freq: Every day | ORAL | Status: DC
Start: 2012-08-08 — End: 2012-08-08

## 2012-08-08 MED ORDER — ACETAMINOPHEN 650 MG RE SUPP: 650.0000 mg | RECTAL | Status: DC | PRN

## 2012-08-08 MED ORDER — TERAZOSIN HCL 5 MG PO CAPS: 10.0000 mg | ORAL_CAPSULE | Freq: Every day | ORAL | Status: DC

## 2012-08-08 MED ORDER — ACETAMINOPHEN 10 MG/ML IV SOLN
1000.0000 mg | Freq: Four times a day (QID) | INTRAVENOUS | Status: AC
  Administered 2012-08-08 – 2012-08-09 (×3): 1000 mg via INTRAVENOUS
  Filled 2012-08-08 (×3): qty 100

## 2012-08-08 MED ORDER — LIDOCAINE HCL (CARDIAC) 20 MG/ML IV SOLN
INTRAVENOUS | Status: DC | PRN
  Administered 2012-08-08: 60 mg via INTRAVENOUS

## 2012-08-08 MED ORDER — GLYCOPYRROLATE 0.2 MG/ML IJ SOLN
INTRAMUSCULAR | Status: DC | PRN
  Administered 2012-08-08: 0.6 mg via INTRAVENOUS

## 2012-08-08 MED ORDER — ARTIFICIAL TEARS OP OINT
TOPICAL_OINTMENT | OPHTHALMIC | Status: DC | PRN
  Administered 2012-08-08: 1 via OPHTHALMIC

## 2012-08-08 MED ORDER — FINASTERIDE 5 MG PO TABS
5.0000 mg | ORAL_TABLET | Freq: Every day | ORAL | Status: DC
  Administered 2012-08-08: 5 mg via ORAL
  Filled 2012-08-08 (×2): qty 1

## 2012-08-08 MED ORDER — METHOCARBAMOL 500 MG PO TABS
500.0000 mg | ORAL_TABLET | Freq: Four times a day (QID) | ORAL | Status: DC | PRN
  Administered 2012-08-08 – 2012-08-09 (×2): 500 mg via ORAL
  Filled 2012-08-08 (×2): qty 1

## 2012-08-08 MED ORDER — 0.9 % SODIUM CHLORIDE (POUR BTL) OPTIME
TOPICAL | Status: DC | PRN
  Administered 2012-08-08: 1000 mL

## 2012-08-08 MED ORDER — ROCURONIUM BROMIDE 100 MG/10ML IV SOLN
INTRAVENOUS | Status: DC | PRN
  Administered 2012-08-08: 10 mg via INTRAVENOUS
  Administered 2012-08-08: 50 mg via INTRAVENOUS
  Administered 2012-08-08: 20 mg via INTRAVENOUS

## 2012-08-08 MED ORDER — NEOSTIGMINE METHYLSULFATE 1 MG/ML IJ SOLN
INTRAMUSCULAR | Status: DC | PRN
  Administered 2012-08-08: 4 mg via INTRAVENOUS

## 2012-08-08 MED ORDER — ACETAMINOPHEN 325 MG PO TABS: 650.0000 mg | ORAL_TABLET | ORAL | Status: DC | PRN

## 2012-08-08 MED ORDER — MUPIROCIN 2 % EX OINT
TOPICAL_OINTMENT | Freq: Two times a day (BID) | CUTANEOUS | Status: AC
  Administered 2012-08-08: 22:00:00 via NASAL

## 2012-08-08 MED ORDER — LIDOCAINE HCL 4 % MT SOLN
OROMUCOSAL | Status: DC | PRN
  Administered 2012-08-08: 4 mL via TOPICAL

## 2012-08-08 MED ORDER — THROMBIN 5000 UNITS EX SOLR
CUTANEOUS | Status: DC | PRN
  Administered 2012-08-08 (×3): 5000 [IU] via TOPICAL

## 2012-08-08 MED ORDER — LACTATED RINGERS IV SOLN
INTRAVENOUS | Status: DC | PRN
  Administered 2012-08-08 (×2): via INTRAVENOUS

## 2012-08-08 MED ORDER — IRBESARTAN 75 MG PO TABS
75.0000 mg | ORAL_TABLET | Freq: Every day | ORAL | Status: DC
  Administered 2012-08-08 – 2012-08-09 (×2): 75 mg via ORAL
  Filled 2012-08-08 (×2): qty 1

## 2012-08-08 MED ORDER — HYDROMORPHONE HCL PF 1 MG/ML IJ SOLN
INTRAMUSCULAR | Status: AC
  Filled 2012-08-08: qty 1

## 2012-08-08 MED ORDER — OXYCODONE HCL 5 MG/5ML PO SOLN: 5.0000 mg | Freq: Once | ORAL | Status: DC | PRN

## 2012-08-08 MED ORDER — SODIUM CHLORIDE 0.9 % IJ SOLN: 3.0000 mL | INTRAMUSCULAR | Status: DC | PRN

## 2012-08-08 MED ORDER — ACETAMINOPHEN 10 MG/ML IV SOLN
INTRAVENOUS | Status: AC
  Administered 2012-08-08: 1000 mg via INTRAVENOUS
  Filled 2012-08-08: qty 100

## 2012-08-08 MED ORDER — ONDANSETRON HCL 4 MG/2ML IJ SOLN: 4.0000 mg | Freq: Four times a day (QID) | INTRAMUSCULAR | Status: DC | PRN

## 2012-08-08 MED ORDER — HYDROCODONE-ACETAMINOPHEN 5-325 MG PO TABS
1.0000 | ORAL_TABLET | ORAL | Status: DC | PRN
  Administered 2012-08-08 (×2): 1 via ORAL
  Filled 2012-08-08: qty 1
  Filled 2012-08-08: qty 2
  Filled 2012-08-08: qty 1

## 2012-08-08 MED ORDER — OXYCODONE HCL 5 MG PO TABS: 5.0000 mg | ORAL_TABLET | Freq: Once | ORAL | Status: DC | PRN

## 2012-08-08 MED ORDER — CEFAZOLIN SODIUM 1-5 GM-% IV SOLN
1.0000 g | Freq: Three times a day (TID) | INTRAVENOUS | Status: AC
  Administered 2012-08-08 (×2): 1 g via INTRAVENOUS
  Filled 2012-08-08 (×2): qty 50

## 2012-08-08 MED ORDER — HEMOSTATIC AGENTS (NO CHARGE) OPTIME
TOPICAL | Status: DC | PRN
  Administered 2012-08-08: 1 via TOPICAL

## 2012-08-08 MED ORDER — POTASSIUM CHLORIDE IN NACL 20-0.9 MEQ/L-% IV SOLN
INTRAVENOUS | Status: DC
  Filled 2012-08-08 (×3): qty 1000

## 2012-08-08 MED ORDER — EPHEDRINE SULFATE 50 MG/ML IJ SOLN
INTRAMUSCULAR | Status: DC | PRN
  Administered 2012-08-08: 5 mg via INTRAVENOUS
  Administered 2012-08-08: 10 mg via INTRAVENOUS
  Administered 2012-08-08: 5 mg via INTRAVENOUS

## 2012-08-08 MED ORDER — TERAZOSIN HCL 5 MG PO CAPS
10.0000 mg | ORAL_CAPSULE | Freq: Every day | ORAL | Status: DC
  Administered 2012-08-08: 10 mg via ORAL
  Filled 2012-08-08 (×2): qty 2

## 2012-08-08 MED ORDER — DEXTROSE 5 % IV SOLN
500.0000 mg | Freq: Four times a day (QID) | INTRAVENOUS | Status: DC | PRN
  Filled 2012-08-08: qty 5

## 2012-08-08 MED ORDER — BACITRACIN 50000 UNITS IM SOLR
INTRAMUSCULAR | Status: AC
  Filled 2012-08-08: qty 1

## 2012-08-08 MED ORDER — SENNA 8.6 MG PO TABS
1.0000 | ORAL_TABLET | Freq: Two times a day (BID) | ORAL | Status: DC
  Administered 2012-08-08 – 2012-08-09 (×2): 8.6 mg via ORAL
  Filled 2012-08-08 (×3): qty 1

## 2012-08-08 MED ORDER — ZOLPIDEM TARTRATE 5 MG PO TABS
5.0000 mg | ORAL_TABLET | Freq: Every evening | ORAL | Status: DC | PRN
  Administered 2012-08-08: 5 mg via ORAL
  Filled 2012-08-08: qty 1

## 2012-08-08 MED ORDER — SODIUM CHLORIDE 0.9 % IJ SOLN
3.0000 mL | Freq: Two times a day (BID) | INTRAMUSCULAR | Status: DC
  Administered 2012-08-08 – 2012-08-09 (×3): 3 mL via INTRAVENOUS

## 2012-08-08 MED ORDER — ONDANSETRON HCL 4 MG/2ML IJ SOLN
INTRAMUSCULAR | Status: DC | PRN
  Administered 2012-08-08: 4 mg via INTRAVENOUS

## 2012-08-08 SURGICAL SUPPLY — 46 items
BAG DECANTER FOR FLEXI CONT (MISCELLANEOUS) ×2 IMPLANT
BENZOIN TINCTURE PRP APPL 2/3 (GAUZE/BANDAGES/DRESSINGS) ×2 IMPLANT
BUR MATCHSTICK NEURO 3.0 LAGG (BURR) ×2 IMPLANT
CANISTER SUCTION 2500CC (MISCELLANEOUS) ×2 IMPLANT
CLOTH BEACON ORANGE TIMEOUT ST (SAFETY) ×2 IMPLANT
CONT SPEC 4OZ CLIKSEAL STRL BL (MISCELLANEOUS) ×2 IMPLANT
DRAPE LAPAROTOMY 100X72X124 (DRAPES) ×2 IMPLANT
DRAPE MICROSCOPE ZEISS OPMI (DRAPES) ×2 IMPLANT
DRAPE POUCH INSTRU U-SHP 10X18 (DRAPES) ×2 IMPLANT
DRAPE SURG 17X23 STRL (DRAPES) ×2 IMPLANT
DRESSING TELFA 8X3 (GAUZE/BANDAGES/DRESSINGS) ×2 IMPLANT
DRSG OPSITE 4X5.5 SM (GAUZE/BANDAGES/DRESSINGS) ×2 IMPLANT
DURAPREP 26ML APPLICATOR (WOUND CARE) ×2 IMPLANT
ELECT REM PT RETURN 9FT ADLT (ELECTROSURGICAL) ×2
ELECTRODE REM PT RTRN 9FT ADLT (ELECTROSURGICAL) ×1 IMPLANT
GAUZE SPONGE 4X4 16PLY XRAY LF (GAUZE/BANDAGES/DRESSINGS) IMPLANT
GLOVE BIOGEL M 8.0 STRL (GLOVE) ×2 IMPLANT
GLOVE BIOGEL PI IND STRL 8 (GLOVE) ×1 IMPLANT
GLOVE BIOGEL PI INDICATOR 8 (GLOVE) ×1
GLOVE ECLIPSE 6.5 STRL STRAW (GLOVE) ×2 IMPLANT
GLOVE EXAM NITRILE LRG STRL (GLOVE) ×2 IMPLANT
GLOVE SURG SS PI 8.5 STRL IVOR (GLOVE) ×2
GLOVE SURG SS PI 8.5 STRL STRW (GLOVE) ×2 IMPLANT
GOWN BRE IMP SLV AUR LG STRL (GOWN DISPOSABLE) ×2 IMPLANT
GOWN BRE IMP SLV AUR XL STRL (GOWN DISPOSABLE) ×2 IMPLANT
GOWN STRL REIN 2XL LVL4 (GOWN DISPOSABLE) ×2 IMPLANT
HEMOSTAT POWDER KIT SURGIFOAM (HEMOSTASIS) IMPLANT
HEMOSTAT POWDER SURGIFOAM 1G (HEMOSTASIS) ×2 IMPLANT
KIT BASIN OR (CUSTOM PROCEDURE TRAY) ×2 IMPLANT
KIT ROOM TURNOVER OR (KITS) ×2 IMPLANT
NEEDLE HYPO 25X1 1.5 SAFETY (NEEDLE) ×2 IMPLANT
NEEDLE SPNL 20GX3.5 QUINCKE YW (NEEDLE) IMPLANT
NS IRRIG 1000ML POUR BTL (IV SOLUTION) ×2 IMPLANT
PACK LAMINECTOMY NEURO (CUSTOM PROCEDURE TRAY) ×2 IMPLANT
PAD ARMBOARD 7.5X6 YLW CONV (MISCELLANEOUS) ×6 IMPLANT
RUBBERBAND STERILE (MISCELLANEOUS) ×4 IMPLANT
SPONGE SURGIFOAM ABS GEL SZ50 (HEMOSTASIS) ×2 IMPLANT
STRIP CLOSURE SKIN 1/2X4 (GAUZE/BANDAGES/DRESSINGS) ×2 IMPLANT
SUT VIC AB 0 CT1 18XCR BRD8 (SUTURE) ×1 IMPLANT
SUT VIC AB 0 CT1 8-18 (SUTURE) ×1
SUT VIC AB 2-0 CP2 18 (SUTURE) ×2 IMPLANT
SUT VIC AB 3-0 SH 8-18 (SUTURE) ×2 IMPLANT
SYR 20ML ECCENTRIC (SYRINGE) ×2 IMPLANT
TOWEL OR 17X24 6PK STRL BLUE (TOWEL DISPOSABLE) ×2 IMPLANT
TOWEL OR 17X26 10 PK STRL BLUE (TOWEL DISPOSABLE) ×2 IMPLANT
WATER STERILE IRR 1000ML POUR (IV SOLUTION) ×2 IMPLANT

## 2012-08-08 NOTE — H&P (Signed)
Subjective: Patient is a 79 y.o. male admitted for DLL for stenosis. Onset of symptoms was many months ago, gradually worse since that time.  The pain is rated moderate and is located at the low back and radiates to legs. His legs feel weak. The pain is described as aching and occurs intermittently. The symptoms have been progressive. Symptoms are exacerbated by walking. MRI or CT showed stenosis L4-5 L5-S1.  Past Medical History  Diagnosis Date  . Hypertension   . Arthritis   . Hearing decreased   . Coronary artery disease     mild, non-obstructive CAD by 2001 cath    Past Surgical History  Procedure Laterality Date  . Joint replacement Bilateral     hip  . Inner ear surgery      Prior to Admission medications   Medication Sig Start Date End Date Taking? Authorizing Provider  aspirin EC 81 MG tablet Take 162 mg by mouth daily. Stopped on 5/13 prior to upcoming surgery.   Yes Historical Provider, MD  cholecalciferol (VITAMIN D) 1000 UNITS tablet Take 1,000 Units by mouth daily.   Yes Historical Provider, MD  finasteride (PROSCAR) 5 MG tablet Take 5 mg by mouth at bedtime.   Yes Historical Provider, MD  Multiple Vitamins-Minerals (MULTIVITAMIN WITH MINERALS) tablet Take 1 tablet by mouth daily.   Yes Historical Provider, MD  rosuvastatin (CRESTOR) 5 MG tablet Take 5 mg by mouth at bedtime.   Yes Historical Provider, MD  terazosin (HYTRIN) 10 MG capsule Take 10 mg by mouth at bedtime.   Yes Historical Provider, MD  traMADol (ULTRAM) 50 MG tablet Take 50-100 mg by mouth at bedtime as needed for pain.   Yes Historical Provider, MD  valsartan-hydrochlorothiazide (DIOVAN-HCT) 160-25 MG per tablet Take 0.5 tablets by mouth daily.   Yes Historical Provider, MD  zolpidem (AMBIEN) 5 MG tablet Take 5 mg by mouth at bedtime as needed for sleep.   Yes Historical Provider, MD   No Known Allergies  History  Substance Use Topics  . Smoking status: Never Smoker   . Smokeless tobacco: Not on file   . Alcohol Use: 0.5 oz/week    1 drink(s) per week    No family history on file.   Review of Systems  Positive ROS: neg  All other systems have been reviewed and were otherwise negative with the exception of those mentioned in the HPI and as above.  Objective: Vital signs in last 24 hours:    General Appearance: Alert, cooperative, no distress, appears stated age Head: Normocephalic, without obvious abnormality, atraumatic Eyes: PERRL, conjunctiva/corneas clear, EOM's intact    Neck: Supple, symmetrical, trachea midline Lungs:  respirations unlabored Heart: Regular rate and rhythm Abdomen: Soft, non-tender Extremities: Extremities normal, atraumatic, no cyanosis or edema   NEUROLOGIC:   Mental status: Alert and oriented x4,  no aphasia, good attention span, fund of knowledge, and memory Motor Exam - grossly normal to in-bed exam Sensory Exam - grossly normal Reflexes: decreased Coordination - grossly normal Gait - not tested Balance - not tested Cranial Nerves: I: smell Not tested  II: visual acuity  OS: nl    OD: nl  II: visual fields Full to confrontation  II: pupils Equal, round, reactive to light  III,VII: ptosis None  III,IV,VI: extraocular muscles  Full ROM  V: mastication Normal  V: facial light touch sensation  Normal  V,VII: corneal reflex  Present  VII: facial muscle function - upper  Normal  VII: facial muscle function -  lower Normal  VIII: hearing Not tested  IX: soft palate elevation  Normal  IX,X: gag reflex Present  XI: trapezius strength  5/5  XI: sternocleidomastoid strength 5/5  XI: neck flexion strength  5/5  XII: tongue strength  Normal    Data Review Lab Results  Component Value Date   WBC 7.6 08/03/2012   HGB 13.4 08/03/2012   HCT 38.9* 08/03/2012   MCV 96.8 08/03/2012   PLT 167 08/03/2012   Lab Results  Component Value Date   NA 137 08/03/2012   K 4.2 08/03/2012   CL 100 08/03/2012   CO2 29 08/03/2012   BUN 33* 08/03/2012    CREATININE 1.26 08/03/2012   GLUCOSE 87 08/03/2012   Lab Results  Component Value Date   INR 1.11 03/05/2009    Assessment/Plan: Patient admitted for decompressive lum lam for stenosis L4-5, L5-S1 causing leg weakness. Patient has failed conservative therapy.  I explained the condition and procedure to the patient and answered any questions.  Patient wishes to proceed with procedure as planned. Understands risks/ benefits and typical outcomes of procedure.   JONES,DAVID S 08/08/2012 6:12 AM

## 2012-08-08 NOTE — Anesthesia Postprocedure Evaluation (Signed)
Anesthesia Post Note  Patient: [name]  Procedure(s) Performed: Procedure(s) (LRB): LUMBAR FOUR TO FIVE, LUMBAR FIVE TO SACRAL ONE LUMBAR DECOMPRESSION (N/A)  Anesthesia type: General  Patient location: PACU  Post pain: Pain level controlled and Adequate analgesia  Post assessment: Post-op Vital signs reviewed, Patient's Cardiovascular Status Stable, Respiratory Function Stable, Patent Airway and Pain level controlled  Last Vitals:  Filed Vitals:   08/08/12 1116  BP:   Pulse: 58  Temp: 36.1 C  Resp: 15    Post vital signs: Reviewed and stable  Level of consciousness: awake, alert  and oriented  Complications: No apparent anesthesia complications

## 2012-08-08 NOTE — Preoperative (Signed)
Beta Blockers   Reason not to administer Beta Blockers:Not Applicable 

## 2012-08-08 NOTE — Anesthesia Preprocedure Evaluation (Addendum)
Anesthesia Evaluation  Patient identified by MRN, date of birth, ID band Patient awake    Reviewed: Allergy & Precautions, H&P , NPO status , Patient's Chart, lab work & pertinent test results  History of Anesthesia Complications Negative for: history of anesthetic complications  Airway Mallampati: II TM Distance: >3 FB Neck ROM: full    Dental  (+) Teeth Intact and Dental Advisory Given   Pulmonary neg pulmonary ROS,  breath sounds clear to auscultation        Cardiovascular hypertension, Pt. on medications + CAD Rhythm:Regular Rate:Bradycardia     Neuro/Psych negative psych ROS   GI/Hepatic negative GI ROS, Neg liver ROS,   Endo/Other  negative endocrine ROS  Renal/GU negative Renal ROS     Musculoskeletal   Abdominal   Peds  Hematology negative hematology ROS (+)   Anesthesia Other Findings   Reproductive/Obstetrics negative OB ROS                          Anesthesia Physical Anesthesia Plan  ASA: II  Anesthesia Plan: General   Post-op Pain Management:    Induction: Intravenous  Airway Management Planned: Oral ETT  Additional Equipment:   Intra-op Plan:   Post-operative Plan: Extubation in OR  Informed Consent: I have reviewed the patients History and Physical, chart, labs and discussed the procedure including the risks, benefits and alternatives for the proposed anesthesia with the patient or authorized representative who has indicated his/her understanding and acceptance.     Plan Discussed with: CRNA, Anesthesiologist and Surgeon  Anesthesia Plan Comments:         Anesthesia Quick Evaluation

## 2012-08-08 NOTE — Progress Notes (Signed)
Patient ID: [name], male   DOB: 1926-10-20, 79 y.o.   MRN: [id number] Looks great postop. No pain in the legs and moving legs well.

## 2012-08-08 NOTE — Addendum Note (Signed)
Addendum created 08/08/12 1142 by Lovie Chol, CRNA   Modules edited: Anesthesia Blocks and Procedures, Anesthesia Medication Administration

## 2012-08-08 NOTE — Transfer of Care (Signed)
Immediate Anesthesia Transfer of Care Note  Patient: [name]  Procedure(s) Performed: Procedure(s) with comments: LUMBAR FOUR TO FIVE, LUMBAR FIVE TO SACRAL ONE LUMBAR DECOMPRESSION (N/A) - LUMBAR LAMINECTOMY/DECOMPRESSION MICRODISCECTOMY 2 LEVELS  Patient Location: PACU  Anesthesia Type:General  Level of Consciousness: awake, oriented and patient cooperative  Airway & Oxygen Therapy: Patient Spontanous Breathing and Patient connected to nasal cannula oxygen  Post-op Assessment: Report given to PACU RN and Post -op Vital signs reviewed and stable  Post vital signs: Reviewed and stable  Complications: No apparent anesthesia complications

## 2012-08-08 NOTE — Op Note (Signed)
08/08/2012  10:35 AM  PATIENT:  [name]  79 y.o. male  PRE-OPERATIVE DIAGNOSIS:  Lumbar spinal stenosis L4-5 and L5-S1 with leg weakness  POST-OPERATIVE DIAGNOSIS:  Same  PROCEDURE:  Decompressive lumbar laminectomy, medial facetectomy, and foraminotomies L4-5 L5-S1 for decompression of central canal and the L4, L5 and S1 nerve roots  SURGEON:  Marikay Alar, MD  ASSISTANTS: Dr. Franky Macho  ANESTHESIA:   General  EBL: 100 ml  Total I/O In: 1250 [I.V.:1250] Out: 100 [Blood:100]  BLOOD ADMINISTERED:none  DRAINS: None   SPECIMEN:  No Specimen  INDICATION FOR PROCEDURE: This patient presented to long history of difficulty using his legs. He some back pain. An MRI which showed severe spinal stenosis at L4-5 and L5-S1. He tried medical management, physical therapy, and injections laterally. I recommended decompressive laminectomy in hopes of improving his leg strength.  Patient understood the risks, benefits, and alternatives and potential outcomes and wished to proceed.  PROCEDURE DETAILS: The patient was taken to the operating room and after induction of adequate generalized endotracheal anesthesia, the patient was rolled into the prone position on the Wilson frame and all pressure points were padded. The lumbar region was cleaned and then prepped with DuraPrep and draped in the usual sterile fashion. 5 cc of local anesthesia was injected and then a dorsal midline incision was made and carried down to the lumbo sacral fascia. The fascia was opened and the paraspinous musculature was taken down in a subperiosteal fashion to expose L4-5 and L5-S1 bilaterally. Intraoperative x-ray confirmed my level, and then I used a combination of the high-speed drill and the Kerrison punches to perform a laminectomy, medial facetectomy, and foraminotomy at L4-5 and L5-S1 bilaterally. The underlying yellow ligament was opened and removed in a piecemeal fashion to expose the underlying dura and exiting  nerve roots. I undercut the lateral recess and dissected down until I was medial to and distal to the L4, L5, and S1 pedicles. The nerve root was well decompressed at each level as was the central canal. We then gently retracted the nerve root medially with a retractor, and inspected the disc spaces and found disc bulges but no obvious disc herniation.  I then palpated with a coronary dilator along the nerve root and into the foramen to assure adequate decompression. I felt no more compression of the nerve root. I irrigated with saline solution containing bacitracin. Achieved hemostasis with bipolar cautery, lined the dura with Gelfoam, and then closed the fascia with 0 Vicryl. I closed the subcutaneous tissues with 2-0 Vicryl and the subcuticular tissues with 3-0 Vicryl. The skin was then closed with benzoin and Steri-Strips. The drapes were removed, a sterile dressing was applied. The patient was awakened from general anesthesia and transferred to the recovery room in stable condition. At the end of the procedure all sponge, needle and instrument counts were correct.   PLAN OF CARE: Admit to inpatient   PATIENT DISPOSITION:  PACU - hemodynamically stable.   Delay start of Pharmacological VTE agent (>24hrs) due to surgical blood loss or risk of bleeding:  yes

## 2012-08-08 NOTE — Plan of Care (Signed)
Problem: Consults Goal: Diagnosis - Spinal Surgery Outcome: Completed/Met Date Met:  08/08/12 Microdiscectomy

## 2012-08-08 NOTE — Anesthesia Procedure Notes (Signed)
Procedure Name: Intubation Date/Time: 08/08/2012 8:23 AM Performed by: Lovie Chol Pre-anesthesia Checklist: Patient identified, Emergency Drugs available, Suction available, Patient being monitored and Timeout performed Patient Re-evaluated:Patient Re-evaluated prior to inductionOxygen Delivery Method: Circle system utilized Preoxygenation: Pre-oxygenation with 100% oxygen Intubation Type: IV induction Ventilation: Mask ventilation without difficulty Laryngoscope Size: Miller and 2 Grade View: Grade II Tube type: Oral Tube size: 7.5 mm Number of attempts: 1 Airway Equipment and Method: Stylet and LTA kit utilized Placement Confirmation: ETT inserted through vocal cords under direct vision,  positive ETCO2,  CO2 detector and breath sounds checked- equal and bilateral Secured at: 21 cm Tube secured with: Tape Dental Injury: Teeth and Oropharynx as per pre-operative assessment

## 2012-08-09 ENCOUNTER — Encounter (HOSPITAL_COMMUNITY): Payer: Self-pay | Admitting: Neurological Surgery

## 2012-08-09 NOTE — Progress Notes (Signed)
Pt. Alert and oriented,follows simple instructions, denies pain. Incision area without swelling, redness or S/S of infection. Voiding adequate clear yellow urine. Moving all extremities well and vitals stable and documented. Lumbar surgery notes instructions given to patient and family member for home safety and precautions. Pt. and family stated understanding of instructions given.  

## 2012-08-09 NOTE — Evaluation (Signed)
Physical Therapy Evaluation Patient Details Name: [name] MRN: [id number] DOB: Jul 30, 1926 Today's Date: 08/09/2012 Time: 1120-1130 PT Time Calculation (min): 10 min  PT Assessment / Plan / Recommendation Clinical Impression  Pt is an 79 y/o male s/p lumbar laminectomy/ microdiscetomy.  Pt educated in back precaution and provided handouts for home use.  Pt demonstrates safety with mobility and presents with no further acute PT needs.      PT Assessment  Patent does not need any further PT services    Follow Up Recommendations  No PT follow up    Does the patient have the potential to tolerate intense rehabilitation      Barriers to Discharge        Equipment Recommendations  None recommended by PT    Recommendations for Other Services     Frequency      Precautions / Restrictions Precautions Precautions: Back Precaution Booklet Issued: Yes (comment) Precaution Comments: Educated pt and spouse on  Restrictions Weight Bearing Restrictions: No   Pertinent Vitals/Pain C/o soreness in back 3/10 premedicated.       Mobility  Bed Mobility Bed Mobility: Rolling Right;Right Sidelying to Sit;Sit to Sidelying Right Rolling Right: 5: Supervision Right Sidelying to Sit: 5: Supervision Sit to Sidelying Right: 5: Supervision Details for Bed Mobility Assistance: Verbal and tactile cues as well as visual demonstration of proper log roll technique and transition to sit/sidelying with minimal strain on trunk musculature.   Transfers Transfers: Sit to Stand;Stand to Sit Sit to Stand: 7: Independent Stand to Sit: 7: Independent Ambulation/Gait Ambulation/Gait Assistance: Not tested (comment) (pt ambulating independently in th halls ) Wheelchair Mobility Wheelchair Mobility: No    Exercises     PT Diagnosis:    PT Problem List:   PT Treatment Interventions:     PT Goals Acute Rehab PT Goals PT Goal Formulation: With patient/family  Visit Information  Last PT  Received On: 08/09/12    Subjective Data  Subjective: Agree to PT eval. Patient Stated Goal: Going home    Prior Functioning  Home Living Lives With: Spouse Available Help at Discharge: Family;Available 24 hours/day Type of Home: House Home Access: Stairs to enter [employer] of Steps: 1 Entrance Stairs-Rails: None Home Layout: One level Bathroom Shower/Tub: Network engineer: None Prior Function Level of Independence: Independent Able to Take Stairs?: Yes Driving: Yes Vocation: Retired    Copywriter, advertising Arousal/Alertness: Awake/alert Behavior During Therapy: WFL for tasks assessed/performed Overall Cognitive Status: Within Functional Limits for tasks assessed    Extremity/Trunk Assessment Right Upper Extremity Assessment RUE ROM/Strength/Tone: Within functional levels Left Upper Extremity Assessment LUE ROM/Strength/Tone: Within functional levels Right Lower Extremity Assessment RLE ROM/Strength/Tone: Within functional levels Left Lower Extremity Assessment LLE ROM/Strength/Tone: Within functional levels   Balance    End of Session PT - End of Session Equipment Utilized During Treatment: Gait belt Activity Tolerance: Patient tolerated treatment well Patient left: in chair;with call bell/phone within reach;with family/visitor present Nurse Communication: Mobility status  GP     [name] 08/09/2012, 4:34 PM [name] L. [name], DPT   Pager [phone]     Cell [phone]

## 2012-08-09 NOTE — Discharge Summary (Signed)
Physician Discharge Summary  Patient ID: [name] MRN: [id number] DOB/AGE: 77-Nov-1928 77 y.o.  Admit date: 08/08/2012 Discharge date: 08/09/2012  Admission Diagnoses: lumbar stenosis with leg weakness    Discharge Diagnoses: same   Discharged Condition: good  Hospital Course: The patient was admitted on 08/08/2012 and taken to the operating room where the patient underwent Lumbar lam L4-5, L5-s1. The patient tolerated the procedure well and was taken to the recovery room and then to the floor in stable condition. The hospital course was routine. There were no complications. The wound remained clean dry and intact. Pt had appropriate back soreness. No complaints of leg pain or new N/T/W. The patient remained afebrile with stable vital signs, and tolerated a regular diet. The patient continued to increase activities and was walking better than pre-op, and pain was well controlled with oral pain medications.   Consults: None  Significant Diagnostic Studies:  Results for orders placed during the hospital encounter of 08/03/12  SURGICAL PCR SCREEN      Result Value Range   MRSA, PCR NEGATIVE  NEGATIVE   Staphylococcus aureus POSITIVE (*) NEGATIVE  BASIC METABOLIC PANEL      Result Value Range   Sodium 137  135 - 145 mEq/L   Potassium 4.2  3.5 - 5.1 mEq/L   Chloride 100  96 - 112 mEq/L   CO2 29  19 - 32 mEq/L   Glucose, Bld 87  70 - 99 mg/dL   BUN 33 (*) 6 - 23 mg/dL   Creatinine, Ser 7.82  0.50 - 1.35 mg/dL   Calcium 9.3  8.4 - 95.6 mg/dL   GFR calc non Af Amer 51 (*) >90 mL/min   GFR calc Af Amer 59 (*) >90 mL/min  CBC WITH DIFFERENTIAL      Result Value Range   WBC 7.6  4.0 - 10.5 K/uL   RBC 4.02 (*) 4.22 - 5.81 MIL/uL   Hemoglobin 13.4  13.0 - 17.0 g/dL   HCT 21.3 (*) 08.6 - 57.8 %   MCV 96.8  78.0 - 100.0 fL   MCH 33.3  26.0 - 34.0 pg   MCHC 34.4  30.0 - 36.0 g/dL   RDW 46.9  62.9 - 52.8 %   Platelets 167  150 - 400 K/uL   Neutrophils Relative % 66  43 - 77 %   Neutro Abs 5.1  1.7 - 7.7 K/uL   Lymphocytes Relative 23  12 - 46 %   Lymphs Abs 1.7  0.7 - 4.0 K/uL   Monocytes Relative 9  3 - 12 %   Monocytes Absolute 0.7  0.1 - 1.0 K/uL   Eosinophils Relative 2  0 - 5 %   Eosinophils Absolute 0.2  0.0 - 0.7 K/uL   Basophils Relative 1  0 - 1 %   Basophils Absolute 0.0  0.0 - 0.1 K/uL    Chest 2 View  08/08/2012   *RADIOLOGY REPORT*  Clinical Data: Preoperative respiratory exam.  Lumbar spinal stenosis.  CHEST - 2 VIEW  Comparison: 02/16/2009  Findings: The heart size and pulmonary vascularity are normal and the lungs are clear except for minimal scarring at the left base laterally.  No significant osseous abnormality.  IMPRESSION: No significant abnormality.   Original Report Authenticated By: Francene Boyers, M.D.   Dg Lumbar Spine 2-3 Views  08/08/2012   *RADIOLOGY REPORT*  Clinical Data: Lumbar laminectomy  LUMBAR SPINE - 2-3 VIEW  Comparison: MR 01/18/2005  Findings: First lateral intraoperative radiograph  demonstrates needles from posterior approach projecting over the L5 spinous process at the level of the L5-S1 interspace.  Second film demonstrates metallic probe projecting between L4 and L5 spinous processes.  IMPRESSION:  Intraoperative localization   Original Report Authenticated By: D. Andria Rhein, MD    Antibiotics:  Anti-infectives   Start     Dose/Rate Route Frequency Ordered Stop   08/08/12 1400  ceFAZolin (ANCEF) IVPB 1 g/50 mL premix     1 g 100 mL/hr over 30 Minutes Intravenous Every 8 hours 08/08/12 1157 08/08/12 2233   08/08/12 0903  bacitracin 50,000 Units in sodium chloride irrigation 0.9 % 500 mL irrigation  Status:  Discontinued       As needed 08/08/12 0904 08/08/12 1038   08/08/12 0804  bacitracin 95621 UNITS injection    Comments:  LOWERY, STEVEN: cabinet override      08/08/12 0804 08/08/12 2014   08/08/12 0600  ceFAZolin (ANCEF) IVPB 2 g/50 mL premix     2 g 100 mL/hr over 30 Minutes Intravenous On call to O.R. 08/07/12  1422 08/08/12 0825      Discharge Exam: Blood pressure 127/84, pulse 56, temperature 98.6 F (37 C), temperature source Oral, resp. rate 18, SpO2 94.00%. Neurologic: Grossly normal Incision CDI  Discharge Medications:     Medication List    TAKE these medications       aspirin EC 81 MG tablet  Take 162 mg by mouth daily. Stopped on 5/13 prior to upcoming surgery.     cholecalciferol 1000 UNITS tablet  Commonly known as:  VITAMIN D  Take 1,000 Units by mouth daily.     finasteride 5 MG tablet  Commonly known as:  PROSCAR  Take 5 mg by mouth at bedtime.     multivitamin with minerals tablet  Take 1 tablet by mouth daily.     rosuvastatin 5 MG tablet  Commonly known as:  CRESTOR  Take 5 mg by mouth at bedtime.     terazosin 10 MG capsule  Commonly known as:  HYTRIN  Take 10 mg by mouth at bedtime.     traMADol 50 MG tablet  Commonly known as:  ULTRAM  Take 50-100 mg by mouth at bedtime as needed for pain.     valsartan-hydrochlorothiazide 160-25 MG per tablet  Commonly known as:  DIOVAN-HCT  Take 0.5 tablets by mouth daily.     zolpidem 5 MG tablet  Commonly known as:  AMBIEN  Take 5 mg by mouth at bedtime as needed for sleep.        Disposition: home   Final Dx: Lum lam for stenosis      Discharge Orders   Future Orders Complete By Expires     Call MD for:  difficulty breathing, headache or visual disturbances  As directed     Call MD for:  persistant nausea and vomiting  As directed     Call MD for:  redness, tenderness, or signs of infection (pain, swelling, redness, odor or green/yellow discharge around incision site)  As directed     Call MD for:  severe uncontrolled pain  As directed     Call MD for:  temperature >100.4  As directed     Diet - low sodium heart healthy  As directed     Discharge instructions  As directed     Comments:      No strenuous activity, no heavy lifting, careful with bending    Increase activity slowly  As directed      Remove dressing in 48 hours  As directed        Follow-up Information   Follow up with Owyn Raulston S, MD In 2 weeks.   Contact information:   [address] [place] [address] [phone]        Signed: Tia Alert 08/09/2012, 10:53 AM

## 2012-08-17 DIAGNOSIS — E7800 Pure hypercholesterolemia, unspecified: Secondary | ICD-10-CM | POA: Diagnosis not present

## 2012-08-17 DIAGNOSIS — I498 Other specified cardiac arrhythmias: Secondary | ICD-10-CM | POA: Diagnosis not present

## 2012-08-17 DIAGNOSIS — I4949 Other premature depolarization: Secondary | ICD-10-CM | POA: Diagnosis not present

## 2012-08-17 DIAGNOSIS — I2510 Atherosclerotic heart disease of native coronary artery without angina pectoris: Secondary | ICD-10-CM | POA: Diagnosis not present

## 2012-08-24 DIAGNOSIS — R259 Unspecified abnormal involuntary movements: Secondary | ICD-10-CM | POA: Diagnosis not present

## 2012-08-24 DIAGNOSIS — G479 Sleep disorder, unspecified: Secondary | ICD-10-CM | POA: Diagnosis not present

## 2012-08-24 DIAGNOSIS — N400 Enlarged prostate without lower urinary tract symptoms: Secondary | ICD-10-CM | POA: Diagnosis not present

## 2012-08-24 DIAGNOSIS — F411 Generalized anxiety disorder: Secondary | ICD-10-CM | POA: Diagnosis not present

## 2012-09-17 DIAGNOSIS — L570 Actinic keratosis: Secondary | ICD-10-CM | POA: Diagnosis not present

## 2012-09-28 DIAGNOSIS — Z79899 Other long term (current) drug therapy: Secondary | ICD-10-CM | POA: Diagnosis not present

## 2012-09-28 DIAGNOSIS — Z0000 Encounter for general adult medical examination without abnormal findings: Secondary | ICD-10-CM | POA: Diagnosis not present

## 2012-09-28 DIAGNOSIS — E782 Mixed hyperlipidemia: Secondary | ICD-10-CM | POA: Diagnosis not present

## 2012-10-01 DIAGNOSIS — N183 Chronic kidney disease, stage 3 unspecified: Secondary | ICD-10-CM | POA: Diagnosis not present

## 2012-10-01 DIAGNOSIS — M545 Low back pain, unspecified: Secondary | ICD-10-CM | POA: Diagnosis not present

## 2012-10-01 DIAGNOSIS — M4800 Spinal stenosis, site unspecified: Secondary | ICD-10-CM | POA: Diagnosis not present

## 2012-10-01 DIAGNOSIS — Z1331 Encounter for screening for depression: Secondary | ICD-10-CM | POA: Diagnosis not present

## 2012-10-01 DIAGNOSIS — I10 Essential (primary) hypertension: Secondary | ICD-10-CM | POA: Diagnosis not present

## 2012-10-01 DIAGNOSIS — E559 Vitamin D deficiency, unspecified: Secondary | ICD-10-CM | POA: Diagnosis not present

## 2012-10-01 DIAGNOSIS — Z0000 Encounter for general adult medical examination without abnormal findings: Secondary | ICD-10-CM | POA: Diagnosis not present

## 2012-10-01 DIAGNOSIS — I2510 Atherosclerotic heart disease of native coronary artery without angina pectoris: Secondary | ICD-10-CM | POA: Diagnosis not present

## 2012-10-22 DIAGNOSIS — J069 Acute upper respiratory infection, unspecified: Secondary | ICD-10-CM | POA: Diagnosis not present

## 2012-10-30 DIAGNOSIS — R0609 Other forms of dyspnea: Secondary | ICD-10-CM | POA: Diagnosis not present

## 2012-10-30 DIAGNOSIS — R0989 Other specified symptoms and signs involving the circulatory and respiratory systems: Secondary | ICD-10-CM | POA: Diagnosis not present

## 2012-11-04 DIAGNOSIS — M109 Gout, unspecified: Secondary | ICD-10-CM | POA: Diagnosis not present

## 2012-11-06 DIAGNOSIS — Z79899 Other long term (current) drug therapy: Secondary | ICD-10-CM | POA: Diagnosis not present

## 2012-11-06 DIAGNOSIS — M109 Gout, unspecified: Secondary | ICD-10-CM | POA: Diagnosis not present

## 2012-11-21 DIAGNOSIS — R072 Precordial pain: Secondary | ICD-10-CM | POA: Diagnosis not present

## 2012-11-23 ENCOUNTER — Other Ambulatory Visit: Payer: Self-pay | Admitting: Internal Medicine

## 2012-11-23 ENCOUNTER — Ambulatory Visit
Admission: RE | Admit: 2012-11-23 | Discharge: 2012-11-23 | Disposition: A | Discharge status: Home / Self Care | Payer: Medicare Other | Source: Ambulatory Visit | Attending: Internal Medicine | Admitting: Internal Medicine

## 2012-11-23 DIAGNOSIS — R269 Unspecified abnormalities of gait and mobility: Secondary | ICD-10-CM

## 2012-11-23 DIAGNOSIS — F0390 Unspecified dementia without behavioral disturbance: Secondary | ICD-10-CM

## 2012-11-23 DIAGNOSIS — R279 Unspecified lack of coordination: Secondary | ICD-10-CM | POA: Diagnosis not present

## 2012-11-23 DIAGNOSIS — R911 Solitary pulmonary nodule: Secondary | ICD-10-CM

## 2012-11-23 DIAGNOSIS — S2249XA Multiple fractures of ribs, unspecified side, initial encounter for closed fracture: Secondary | ICD-10-CM | POA: Diagnosis not present

## 2012-11-23 DIAGNOSIS — R270 Ataxia, unspecified: Secondary | ICD-10-CM

## 2012-11-23 DIAGNOSIS — F070 Personality change due to known physiological condition: Secondary | ICD-10-CM

## 2012-11-23 DIAGNOSIS — R4182 Altered mental status, unspecified: Secondary | ICD-10-CM | POA: Diagnosis not present

## 2012-11-23 DIAGNOSIS — R918 Other nonspecific abnormal finding of lung field: Secondary | ICD-10-CM | POA: Diagnosis not present

## 2012-11-26 DIAGNOSIS — I319 Disease of pericardium, unspecified: Secondary | ICD-10-CM | POA: Diagnosis not present

## 2012-11-26 DIAGNOSIS — J90 Pleural effusion, not elsewhere classified: Secondary | ICD-10-CM | POA: Diagnosis not present

## 2012-11-26 DIAGNOSIS — E871 Hypo-osmolality and hyponatremia: Secondary | ICD-10-CM | POA: Diagnosis not present

## 2012-11-27 DIAGNOSIS — M48061 Spinal stenosis, lumbar region without neurogenic claudication: Secondary | ICD-10-CM | POA: Diagnosis not present

## 2012-11-28 DIAGNOSIS — H7419 Adhesive middle ear disease, unspecified ear: Secondary | ICD-10-CM | POA: Diagnosis not present

## 2012-11-28 DIAGNOSIS — H903 Sensorineural hearing loss, bilateral: Secondary | ICD-10-CM | POA: Diagnosis not present

## 2012-11-28 DIAGNOSIS — H6980 Other specified disorders of Eustachian tube, unspecified ear: Secondary | ICD-10-CM | POA: Diagnosis not present

## 2012-11-28 DIAGNOSIS — H905 Unspecified sensorineural hearing loss: Secondary | ICD-10-CM | POA: Diagnosis not present

## 2012-11-29 DIAGNOSIS — N289 Disorder of kidney and ureter, unspecified: Secondary | ICD-10-CM | POA: Diagnosis not present

## 2012-12-03 DIAGNOSIS — I499 Cardiac arrhythmia, unspecified: Secondary | ICD-10-CM | POA: Diagnosis not present

## 2012-12-03 DIAGNOSIS — N289 Disorder of kidney and ureter, unspecified: Secondary | ICD-10-CM | POA: Diagnosis not present

## 2012-12-03 DIAGNOSIS — I4891 Unspecified atrial fibrillation: Secondary | ICD-10-CM | POA: Diagnosis not present

## 2012-12-03 DIAGNOSIS — R700 Elevated erythrocyte sedimentation rate: Secondary | ICD-10-CM | POA: Diagnosis not present

## 2012-12-03 DIAGNOSIS — E871 Hypo-osmolality and hyponatremia: Secondary | ICD-10-CM | POA: Diagnosis not present

## 2012-12-03 DIAGNOSIS — R000 Tachycardia, unspecified: Secondary | ICD-10-CM | POA: Diagnosis not present

## 2012-12-03 DIAGNOSIS — J90 Pleural effusion, not elsewhere classified: Secondary | ICD-10-CM | POA: Diagnosis not present

## 2012-12-04 DIAGNOSIS — I4891 Unspecified atrial fibrillation: Secondary | ICD-10-CM | POA: Diagnosis not present

## 2012-12-04 DIAGNOSIS — S20219A Contusion of unspecified front wall of thorax, initial encounter: Secondary | ICD-10-CM | POA: Diagnosis not present

## 2012-12-04 DIAGNOSIS — I319 Disease of pericardium, unspecified: Secondary | ICD-10-CM | POA: Diagnosis not present

## 2012-12-06 DIAGNOSIS — M25519 Pain in unspecified shoulder: Secondary | ICD-10-CM | POA: Diagnosis not present

## 2012-12-06 DIAGNOSIS — J90 Pleural effusion, not elsewhere classified: Secondary | ICD-10-CM | POA: Diagnosis not present

## 2012-12-06 DIAGNOSIS — R700 Elevated erythrocyte sedimentation rate: Secondary | ICD-10-CM | POA: Diagnosis not present

## 2012-12-06 DIAGNOSIS — I319 Disease of pericardium, unspecified: Secondary | ICD-10-CM | POA: Diagnosis not present

## 2012-12-06 DIAGNOSIS — B009 Herpesviral infection, unspecified: Secondary | ICD-10-CM | POA: Diagnosis not present

## 2012-12-06 DIAGNOSIS — I4891 Unspecified atrial fibrillation: Secondary | ICD-10-CM | POA: Diagnosis not present

## 2012-12-06 DIAGNOSIS — M79609 Pain in unspecified limb: Secondary | ICD-10-CM | POA: Diagnosis not present

## 2012-12-11 DIAGNOSIS — I4891 Unspecified atrial fibrillation: Secondary | ICD-10-CM | POA: Diagnosis not present

## 2012-12-11 DIAGNOSIS — I319 Disease of pericardium, unspecified: Secondary | ICD-10-CM | POA: Diagnosis not present

## 2012-12-11 DIAGNOSIS — R700 Elevated erythrocyte sedimentation rate: Secondary | ICD-10-CM | POA: Diagnosis not present

## 2012-12-11 DIAGNOSIS — S2239XA Fracture of one rib, unspecified side, initial encounter for closed fracture: Secondary | ICD-10-CM | POA: Diagnosis not present

## 2012-12-11 DIAGNOSIS — J90 Pleural effusion, not elsewhere classified: Secondary | ICD-10-CM | POA: Diagnosis not present

## 2012-12-12 DIAGNOSIS — M2550 Pain in unspecified joint: Secondary | ICD-10-CM | POA: Diagnosis not present

## 2012-12-12 DIAGNOSIS — M19049 Primary osteoarthritis, unspecified hand: Secondary | ICD-10-CM | POA: Diagnosis not present

## 2012-12-12 DIAGNOSIS — I319 Disease of pericardium, unspecified: Secondary | ICD-10-CM | POA: Diagnosis not present

## 2012-12-12 DIAGNOSIS — R700 Elevated erythrocyte sedimentation rate: Secondary | ICD-10-CM | POA: Diagnosis not present

## 2012-12-12 DIAGNOSIS — R5381 Other malaise: Secondary | ICD-10-CM | POA: Diagnosis not present

## 2012-12-12 DIAGNOSIS — J90 Pleural effusion, not elsewhere classified: Secondary | ICD-10-CM | POA: Diagnosis not present

## 2012-12-13 DIAGNOSIS — J90 Pleural effusion, not elsewhere classified: Secondary | ICD-10-CM | POA: Diagnosis not present

## 2012-12-13 DIAGNOSIS — I319 Disease of pericardium, unspecified: Secondary | ICD-10-CM | POA: Diagnosis not present

## 2012-12-13 DIAGNOSIS — I4891 Unspecified atrial fibrillation: Secondary | ICD-10-CM | POA: Diagnosis not present

## 2012-12-26 ENCOUNTER — Ambulatory Visit
Admission: RE | Admit: 2012-12-26 | Discharge: 2012-12-26 | Disposition: A | Discharge status: Home / Self Care | Payer: Medicare Other | Source: Ambulatory Visit | Attending: Internal Medicine | Admitting: Internal Medicine

## 2012-12-26 ENCOUNTER — Other Ambulatory Visit: Payer: Self-pay | Admitting: Internal Medicine

## 2012-12-26 DIAGNOSIS — I4891 Unspecified atrial fibrillation: Secondary | ICD-10-CM | POA: Diagnosis not present

## 2012-12-26 DIAGNOSIS — R700 Elevated erythrocyte sedimentation rate: Secondary | ICD-10-CM | POA: Diagnosis not present

## 2012-12-26 DIAGNOSIS — R9389 Abnormal findings on diagnostic imaging of other specified body structures: Secondary | ICD-10-CM | POA: Diagnosis not present

## 2012-12-26 DIAGNOSIS — J90 Pleural effusion, not elsewhere classified: Secondary | ICD-10-CM

## 2012-12-26 DIAGNOSIS — I319 Disease of pericardium, unspecified: Secondary | ICD-10-CM | POA: Diagnosis not present

## 2013-01-01 DIAGNOSIS — J90 Pleural effusion, not elsewhere classified: Secondary | ICD-10-CM | POA: Diagnosis not present

## 2013-01-01 DIAGNOSIS — M2550 Pain in unspecified joint: Secondary | ICD-10-CM | POA: Diagnosis not present

## 2013-01-01 DIAGNOSIS — I319 Disease of pericardium, unspecified: Secondary | ICD-10-CM | POA: Diagnosis not present

## 2013-01-01 DIAGNOSIS — R700 Elevated erythrocyte sedimentation rate: Secondary | ICD-10-CM | POA: Diagnosis not present

## 2013-01-10 ENCOUNTER — Ambulatory Visit: Payer: Medicare Other | Admitting: Cardiology

## 2013-01-13 ENCOUNTER — Encounter: Payer: Self-pay | Admitting: *Deleted

## 2013-01-13 ENCOUNTER — Encounter: Payer: Self-pay | Admitting: Cardiology

## 2013-01-13 DIAGNOSIS — H9190 Unspecified hearing loss, unspecified ear: Secondary | ICD-10-CM | POA: Insufficient documentation

## 2013-01-13 DIAGNOSIS — I10 Essential (primary) hypertension: Secondary | ICD-10-CM | POA: Insufficient documentation

## 2013-01-13 DIAGNOSIS — I2510 Atherosclerotic heart disease of native coronary artery without angina pectoris: Secondary | ICD-10-CM | POA: Insufficient documentation

## 2013-01-13 DIAGNOSIS — M1990 Unspecified osteoarthritis, unspecified site: Secondary | ICD-10-CM | POA: Insufficient documentation

## 2013-01-14 ENCOUNTER — Ambulatory Visit (INDEPENDENT_AMBULATORY_CARE_PROVIDER_SITE_OTHER): Payer: Medicare Other | Admitting: Cardiology

## 2013-01-14 ENCOUNTER — Encounter: Payer: Self-pay | Admitting: Cardiology

## 2013-01-14 VITALS — BP 138/84 | HR 74 | Ht 70.0 in | Wt 178.0 lb

## 2013-01-14 DIAGNOSIS — I4891 Unspecified atrial fibrillation: Secondary | ICD-10-CM

## 2013-01-14 DIAGNOSIS — Z7901 Long term (current) use of anticoagulants: Secondary | ICD-10-CM

## 2013-01-14 DIAGNOSIS — Z23 Encounter for immunization: Secondary | ICD-10-CM

## 2013-01-14 DIAGNOSIS — Z0000 Encounter for general adult medical examination without abnormal findings: Secondary | ICD-10-CM | POA: Diagnosis not present

## 2013-01-14 DIAGNOSIS — I2510 Atherosclerotic heart disease of native coronary artery without angina pectoris: Secondary | ICD-10-CM

## 2013-01-14 DIAGNOSIS — I10 Essential (primary) hypertension: Secondary | ICD-10-CM

## 2013-01-14 LAB — CBC
HCT: 42 % (ref 39.0–52.0)
Hemoglobin: 14.2 g/dL (ref 13.0–17.0)
RDW: 17 % — ABNORMAL HIGH (ref 11.5–14.6)
WBC: 10.3 10*3/uL (ref 4.5–10.5)

## 2013-01-14 NOTE — Progress Notes (Signed)
[address] [place], [place]  [address] Phone: [phone] Fax:  [phone]  Date:  01/14/2013   ID:  [name], DOB 01-18-1927, MRN [id number]  PCP:  Pearla Dubonnet, MD   History of Present Illness: [name] is a 79 y.o. male atrial fibrillation in the setting of pericardial effusion, subacute rib fractures, Dr. Kevan Ny is PCP, pacemaker. Treated with prednisone therapy. Has an internist as well as in [place]. On chronic anticoagulation. Echocardiogram showed stabilization of pericardial fluid. Resolution of pericardial effusion.  He has been doing well on anticoagulation. No bleeding, no syncope, no falls.  In regards to atrial fibrillation, he is asymptomatic. Remains in atrial fibrillation. ESR is trending downward and his prednisone dose is also being tapered by Dr. Lendon Colonel of rheumatology.  Wt Readings from Last 3 Encounters:  01/14/13 178 lb (80.74 kg)  08/03/12 176 lb 2.4 oz (79.9 kg)     Past Medical History  Diagnosis Date  . Hypertension   . Arthritis   . Hearing decreased   . Coronary artery disease     mild, non-obstructive CAD by 2001 cath  . Pure hypercholesterolemia   . Allergic rhinitis   . Vitamin D deficiency   . Impacted cerumen   . Myalgia   . PVC (premature ventricular contraction)   . Hyponatremia   . Tachycardia   . Atrial fibrillation   . Spinal stenosis     with back pain - considering neurosurgery with Dr. Marikay Alar, March, 2014  . RBBB     with left anterior hemiblock  . Renal insufficiency     stage III, creatinine 1.41, June, 2013 - Celebrex reduced    Past Surgical History  Procedure Laterality Date  . Joint replacement Bilateral     hip  . Inner ear surgery    . Lumbar laminectomy/decompression microdiscectomy N/A 08/08/2012    Procedure: LUMBAR FOUR TO FIVE, LUMBAR FIVE TO SACRAL ONE LUMBAR DECOMPRESSION;  Surgeon: Tia Alert, MD;  Location: [facility];  Service: Neurosurgery;   Laterality: N/A;  LUMBAR LAMINECTOMY/DECOMPRESSION MICRODISCECTOMY 2 LEVELS  . Cataract extraction    . Hernia repair    . Cardiac catheterization       normal left main, LAD with 40-50% narrowing proximal LAD, diagonal vessel with 20-30% irregular lesion, left circumflex normal, right coronary artery normal, left ventricular function normal - Dr. Delfin Edis    Current Outpatient Prescriptions  Medication Sig Dispense Refill  . cholecalciferol (VITAMIN D) 1000 UNITS tablet Take 1,000 Units by mouth daily.      Marland Kitchen diltiazem (CARDIZEM) 120 MG tablet Take 120 mg by mouth daily.      . finasteride (PROSCAR) 5 MG tablet Take 5 mg by mouth at bedtime.      . Multiple Vitamins-Minerals (MULTIVITAMIN WITH MINERALS) tablet Take 1 tablet by mouth daily.      . predniSONE (DELTASONE) 10 MG tablet Take 10 mg by mouth daily.      . Rivaroxaban (XARELTO) 15 MG TABS tablet Take 15 mg by mouth daily.      . rosuvastatin (CRESTOR) 5 MG tablet Take 5 mg by mouth at bedtime.      Marland Kitchen terazosin (HYTRIN) 10 MG capsule Take 10 mg by mouth at bedtime.      . traMADol (ULTRAM) 50 MG tablet Take 50-100 mg by mouth at bedtime as needed for pain.      . valsartan-hydrochlorothiazide (DIOVAN-HCT) 160-25 MG per  tablet Take 0.5 tablets by mouth daily.      Marland Kitchen zolpidem (AMBIEN) 5 MG tablet Take 5 mg by mouth at bedtime as needed for sleep.       No current facility-administered medications for this visit.    Allergies:   No Known Allergies  Social History:  The patient  reports that he has never smoked. He does not have any smokeless tobacco history on file. He reports that he drinks about 0.5 ounces of alcohol per week. He reports that he does not use illicit drugs.   ROS:  Please see the history of present illness.   No syncope, no bleeding, no orthopnea, no PND    PHYSICAL EXAM: VS:  BP 138/84  Pulse 74  Ht 5\' 10"  (1.778 m)  Wt 178 lb (80.74 kg)  BMI 25.54 kg/m2 Well nourished, well developed, in no acute  distress HEENT: normal Neck: no JVD Cardiac: Irregularly irregular, no appreciable rubs, no murmur  Lungs:  clear to auscultation bilaterally, no wheezing, rhonchi or rales, caps mildly decreased breath sounds left base (chest x-ray reviewed, no effusion) Abd: soft, nontender, no hepatomegaly Ext: no edema Skin: warm and dry Neuro: no focal abnormalities noted  EKG:  Previously atrial fibrillation    ASSESSMENT AND PLAN:  1. Atrial fibrillation-we will continue with diltiazem as well as Xarelto. Lower dose was being utilized because of creatinine clearance. Checking creatinine as well as hemoglobin today. Discussed the rationale of continuing with atrial fibrillation and not cardioversion. He is feeling much better, asymptomatic. Since he is still coming down/tapering off of prednisone, I'm hesitant perform cardioversion as the efficacy would be decreased for long-term restoration of normal rhythm. Continue with rate control. 2. Chronic anticoagulation-as above. Monitoring. 3. Inflammatory illness, pleural/pericardial effusion-resolved. 4. Coronary artery disease-nonobstructive, mild-2001 catheterization. 5. Rib fractures-healing well. Minor left upper chest pain. 6. Requested flu shot. We will see him back in 6 months.  Signed, Donato Schultz, MD [facility]  01/14/2013 2:50 PM

## 2013-01-14 NOTE — Patient Instructions (Signed)
Your physician wants you to follow-up in: 6 months with Dr. Anne Fu. You will receive a reminder letter in the mail two months in advance. If you don't receive a letter, please call our office to schedule the follow-up appointment.  Your physician recommends that you continue on your current medications as directed. Please refer to the Current Medication list given to you today.  Your physician recommends that you return for lab work today for CBC and Creatinine.  You will get your flu shot today.

## 2013-01-15 ENCOUNTER — Telehealth: Payer: Self-pay | Admitting: Cardiology

## 2013-01-15 DIAGNOSIS — H905 Unspecified sensorineural hearing loss: Secondary | ICD-10-CM | POA: Diagnosis not present

## 2013-01-15 DIAGNOSIS — H906 Mixed conductive and sensorineural hearing loss, bilateral: Secondary | ICD-10-CM | POA: Diagnosis not present

## 2013-01-15 DIAGNOSIS — H7419 Adhesive middle ear disease, unspecified ear: Secondary | ICD-10-CM | POA: Diagnosis not present

## 2013-01-15 DIAGNOSIS — H903 Sensorineural hearing loss, bilateral: Secondary | ICD-10-CM | POA: Diagnosis not present

## 2013-01-15 DIAGNOSIS — H65199 Other acute nonsuppurative otitis media, unspecified ear: Secondary | ICD-10-CM | POA: Diagnosis not present

## 2013-01-15 DIAGNOSIS — H6980 Other specified disorders of Eustachian tube, unspecified ear: Secondary | ICD-10-CM | POA: Diagnosis not present

## 2013-01-15 NOTE — Telephone Encounter (Signed)
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

## 2013-01-16 ENCOUNTER — Encounter: Payer: Self-pay | Admitting: Cardiology

## 2013-01-17 DIAGNOSIS — Z961 Presence of intraocular lens: Secondary | ICD-10-CM | POA: Diagnosis not present

## 2013-01-17 NOTE — Telephone Encounter (Signed)
Dr. Anne Fu emailed NPI number to patient.

## 2013-01-18 NOTE — Telephone Encounter (Signed)
Explaind to patients spouse that the lab results are available 4-5 days after they have been resulted. If the patient looks under the office visit they are there immediately after they have been resulted.

## 2013-01-23 DIAGNOSIS — H7419 Adhesive middle ear disease, unspecified ear: Secondary | ICD-10-CM | POA: Diagnosis not present

## 2013-01-23 DIAGNOSIS — R700 Elevated erythrocyte sedimentation rate: Secondary | ICD-10-CM | POA: Diagnosis not present

## 2013-01-23 DIAGNOSIS — M2550 Pain in unspecified joint: Secondary | ICD-10-CM | POA: Diagnosis not present

## 2013-01-23 DIAGNOSIS — I319 Disease of pericardium, unspecified: Secondary | ICD-10-CM | POA: Diagnosis not present

## 2013-01-23 DIAGNOSIS — H65199 Other acute nonsuppurative otitis media, unspecified ear: Secondary | ICD-10-CM | POA: Diagnosis not present

## 2013-01-23 DIAGNOSIS — H6980 Other specified disorders of Eustachian tube, unspecified ear: Secondary | ICD-10-CM | POA: Diagnosis not present

## 2013-01-23 DIAGNOSIS — H906 Mixed conductive and sensorineural hearing loss, bilateral: Secondary | ICD-10-CM | POA: Diagnosis not present

## 2013-01-31 DIAGNOSIS — J90 Pleural effusion, not elsewhere classified: Secondary | ICD-10-CM | POA: Diagnosis not present

## 2013-01-31 DIAGNOSIS — Z79899 Other long term (current) drug therapy: Secondary | ICD-10-CM | POA: Diagnosis not present

## 2013-01-31 DIAGNOSIS — I4891 Unspecified atrial fibrillation: Secondary | ICD-10-CM | POA: Diagnosis not present

## 2013-01-31 DIAGNOSIS — R072 Precordial pain: Secondary | ICD-10-CM | POA: Diagnosis not present

## 2013-01-31 DIAGNOSIS — I319 Disease of pericardium, unspecified: Secondary | ICD-10-CM | POA: Diagnosis not present

## 2013-02-18 DIAGNOSIS — M216X9 Other acquired deformities of unspecified foot: Secondary | ICD-10-CM | POA: Diagnosis not present

## 2013-02-18 DIAGNOSIS — Z111 Encounter for screening for respiratory tuberculosis: Secondary | ICD-10-CM | POA: Diagnosis not present

## 2013-02-18 DIAGNOSIS — K3189 Other diseases of stomach and duodenum: Secondary | ICD-10-CM | POA: Diagnosis not present

## 2013-02-18 DIAGNOSIS — I4891 Unspecified atrial fibrillation: Secondary | ICD-10-CM | POA: Diagnosis not present

## 2013-02-22 DIAGNOSIS — I4891 Unspecified atrial fibrillation: Secondary | ICD-10-CM | POA: Diagnosis not present

## 2013-02-22 DIAGNOSIS — R609 Edema, unspecified: Secondary | ICD-10-CM | POA: Diagnosis not present

## 2013-02-22 DIAGNOSIS — Z87891 Personal history of nicotine dependence: Secondary | ICD-10-CM | POA: Diagnosis not present

## 2013-02-25 DIAGNOSIS — I4891 Unspecified atrial fibrillation: Secondary | ICD-10-CM | POA: Diagnosis not present

## 2013-02-25 DIAGNOSIS — I319 Disease of pericardium, unspecified: Secondary | ICD-10-CM | POA: Diagnosis not present

## 2013-02-25 DIAGNOSIS — M2550 Pain in unspecified joint: Secondary | ICD-10-CM | POA: Diagnosis not present

## 2013-02-25 DIAGNOSIS — F411 Generalized anxiety disorder: Secondary | ICD-10-CM | POA: Diagnosis not present

## 2013-02-25 DIAGNOSIS — R700 Elevated erythrocyte sedimentation rate: Secondary | ICD-10-CM | POA: Diagnosis not present

## 2013-02-25 DIAGNOSIS — R29898 Other symptoms and signs involving the musculoskeletal system: Secondary | ICD-10-CM | POA: Diagnosis not present

## 2013-02-25 DIAGNOSIS — J90 Pleural effusion, not elsewhere classified: Secondary | ICD-10-CM | POA: Diagnosis not present

## 2013-02-26 ENCOUNTER — Other Ambulatory Visit: Payer: Self-pay | Admitting: Neurological Surgery

## 2013-02-26 DIAGNOSIS — M48061 Spinal stenosis, lumbar region without neurogenic claudication: Secondary | ICD-10-CM

## 2013-02-26 DIAGNOSIS — M2550 Pain in unspecified joint: Secondary | ICD-10-CM | POA: Diagnosis not present

## 2013-02-27 DIAGNOSIS — H905 Unspecified sensorineural hearing loss: Secondary | ICD-10-CM | POA: Diagnosis not present

## 2013-02-27 DIAGNOSIS — H7419 Adhesive middle ear disease, unspecified ear: Secondary | ICD-10-CM | POA: Diagnosis not present

## 2013-02-27 DIAGNOSIS — H6980 Other specified disorders of Eustachian tube, unspecified ear: Secondary | ICD-10-CM | POA: Diagnosis not present

## 2013-02-27 DIAGNOSIS — H6520 Chronic serous otitis media, unspecified ear: Secondary | ICD-10-CM | POA: Diagnosis not present

## 2013-02-27 DIAGNOSIS — H906 Mixed conductive and sensorineural hearing loss, bilateral: Secondary | ICD-10-CM | POA: Diagnosis not present

## 2013-02-27 DIAGNOSIS — H903 Sensorineural hearing loss, bilateral: Secondary | ICD-10-CM | POA: Diagnosis not present

## 2013-02-28 ENCOUNTER — Encounter: Payer: Self-pay | Admitting: Cardiology

## 2013-03-02 ENCOUNTER — Ambulatory Visit
Admission: RE | Admit: 2013-03-02 | Discharge: 2013-03-02 | Disposition: A | Discharge status: Home / Self Care | Payer: Medicare Other | Source: Ambulatory Visit | Attending: Neurological Surgery | Admitting: Neurological Surgery

## 2013-03-02 DIAGNOSIS — M48061 Spinal stenosis, lumbar region without neurogenic claudication: Secondary | ICD-10-CM

## 2013-03-02 DIAGNOSIS — M5126 Other intervertebral disc displacement, lumbar region: Secondary | ICD-10-CM | POA: Diagnosis not present

## 2013-03-02 MED ORDER — GADOBENATE DIMEGLUMINE 529 MG/ML IV SOLN
15.0000 mL | Freq: Once | INTRAVENOUS | Status: AC | PRN
  Administered 2013-03-02: 15 mL via INTRAVENOUS

## 2013-03-05 DIAGNOSIS — R059 Cough, unspecified: Secondary | ICD-10-CM | POA: Diagnosis not present

## 2013-03-05 DIAGNOSIS — J069 Acute upper respiratory infection, unspecified: Secondary | ICD-10-CM | POA: Diagnosis not present

## 2013-03-06 DIAGNOSIS — I2510 Atherosclerotic heart disease of native coronary artery without angina pectoris: Secondary | ICD-10-CM | POA: Diagnosis not present

## 2013-03-19 DIAGNOSIS — I10 Essential (primary) hypertension: Secondary | ICD-10-CM | POA: Diagnosis not present

## 2013-03-19 DIAGNOSIS — I4891 Unspecified atrial fibrillation: Secondary | ICD-10-CM | POA: Diagnosis not present

## 2013-03-19 DIAGNOSIS — Z79899 Other long term (current) drug therapy: Secondary | ICD-10-CM | POA: Diagnosis not present

## 2013-03-26 DIAGNOSIS — M48061 Spinal stenosis, lumbar region without neurogenic claudication: Secondary | ICD-10-CM | POA: Diagnosis not present

## 2013-03-27 DIAGNOSIS — N401 Enlarged prostate with lower urinary tract symptoms: Secondary | ICD-10-CM | POA: Diagnosis not present

## 2013-03-27 DIAGNOSIS — D4959 Neoplasm of unspecified behavior of other genitourinary organ: Secondary | ICD-10-CM | POA: Diagnosis not present

## 2013-03-28 DIAGNOSIS — D4959 Neoplasm of unspecified behavior of other genitourinary organ: Secondary | ICD-10-CM | POA: Diagnosis not present

## 2013-03-29 DIAGNOSIS — R197 Diarrhea, unspecified: Secondary | ICD-10-CM | POA: Diagnosis not present

## 2013-04-01 DIAGNOSIS — H903 Sensorineural hearing loss, bilateral: Secondary | ICD-10-CM | POA: Diagnosis not present

## 2013-04-01 DIAGNOSIS — H6980 Other specified disorders of Eustachian tube, unspecified ear: Secondary | ICD-10-CM | POA: Diagnosis not present

## 2013-04-01 DIAGNOSIS — H6520 Chronic serous otitis media, unspecified ear: Secondary | ICD-10-CM | POA: Diagnosis not present

## 2013-04-01 DIAGNOSIS — H906 Mixed conductive and sensorineural hearing loss, bilateral: Secondary | ICD-10-CM | POA: Diagnosis not present

## 2013-04-01 DIAGNOSIS — H7419 Adhesive middle ear disease, unspecified ear: Secondary | ICD-10-CM | POA: Diagnosis not present

## 2013-04-01 DIAGNOSIS — H905 Unspecified sensorineural hearing loss: Secondary | ICD-10-CM | POA: Diagnosis not present

## 2013-04-02 DIAGNOSIS — I4891 Unspecified atrial fibrillation: Secondary | ICD-10-CM | POA: Diagnosis not present

## 2013-04-02 DIAGNOSIS — Z01810 Encounter for preprocedural cardiovascular examination: Secondary | ICD-10-CM | POA: Diagnosis not present

## 2013-04-02 DIAGNOSIS — R609 Edema, unspecified: Secondary | ICD-10-CM | POA: Diagnosis not present

## 2013-04-16 NOTE — Progress Notes (Addendum)
Late entry for missed G-code.  Based on chart review of Nelwyn Salisbury, PT.  08-15-12 1601  PT G-Codes **NOT FOR INPATIENT CLASS**  Functional Assessment Tool Used clinical judgment based on chart review  Functional Limitation Mobility: Walking and moving around  Mobility: Walking and Moving Around Current Status [phone]) CI  Mobility: Walking and Moving Around Goal Status [phone]) CI  Mobility: Walking and Moving Around Discharge Status [phone]) CI  Lavonia Dana, [place]  [phone] 04/16/2013

## 2013-04-24 DIAGNOSIS — L570 Actinic keratosis: Secondary | ICD-10-CM | POA: Diagnosis not present

## 2013-04-29 ENCOUNTER — Ambulatory Visit
Admission: RE | Admit: 2013-04-29 | Discharge: 2013-04-29 | Disposition: A | Discharge status: Home / Self Care | Payer: Medicare Other | Source: Ambulatory Visit | Attending: Rheumatology | Admitting: Rheumatology

## 2013-04-29 ENCOUNTER — Other Ambulatory Visit: Payer: Self-pay | Admitting: Rheumatology

## 2013-04-29 DIAGNOSIS — I319 Disease of pericardium, unspecified: Secondary | ICD-10-CM | POA: Diagnosis not present

## 2013-04-29 DIAGNOSIS — M7989 Other specified soft tissue disorders: Secondary | ICD-10-CM | POA: Diagnosis not present

## 2013-04-29 DIAGNOSIS — M545 Low back pain, unspecified: Secondary | ICD-10-CM | POA: Diagnosis not present

## 2013-04-29 DIAGNOSIS — R609 Edema, unspecified: Secondary | ICD-10-CM

## 2013-04-29 DIAGNOSIS — R700 Elevated erythrocyte sedimentation rate: Secondary | ICD-10-CM | POA: Diagnosis not present

## 2013-04-29 DIAGNOSIS — M2550 Pain in unspecified joint: Secondary | ICD-10-CM | POA: Diagnosis not present

## 2013-04-30 DIAGNOSIS — I10 Essential (primary) hypertension: Secondary | ICD-10-CM | POA: Diagnosis not present

## 2013-04-30 DIAGNOSIS — I4891 Unspecified atrial fibrillation: Secondary | ICD-10-CM | POA: Diagnosis not present

## 2013-04-30 DIAGNOSIS — R7989 Other specified abnormal findings of blood chemistry: Secondary | ICD-10-CM | POA: Diagnosis not present

## 2013-04-30 DIAGNOSIS — R609 Edema, unspecified: Secondary | ICD-10-CM | POA: Diagnosis not present

## 2013-05-03 DIAGNOSIS — M48061 Spinal stenosis, lumbar region without neurogenic claudication: Secondary | ICD-10-CM | POA: Diagnosis not present

## 2013-05-07 DIAGNOSIS — R609 Edema, unspecified: Secondary | ICD-10-CM | POA: Diagnosis not present

## 2013-05-07 DIAGNOSIS — R7989 Other specified abnormal findings of blood chemistry: Secondary | ICD-10-CM | POA: Diagnosis not present

## 2013-05-07 DIAGNOSIS — I10 Essential (primary) hypertension: Secondary | ICD-10-CM | POA: Diagnosis not present

## 2013-05-15 DIAGNOSIS — R079 Chest pain, unspecified: Secondary | ICD-10-CM | POA: Diagnosis not present

## 2013-05-15 DIAGNOSIS — I4891 Unspecified atrial fibrillation: Secondary | ICD-10-CM | POA: Diagnosis not present

## 2013-05-16 DIAGNOSIS — I4891 Unspecified atrial fibrillation: Secondary | ICD-10-CM | POA: Diagnosis not present

## 2013-05-16 DIAGNOSIS — R0602 Shortness of breath: Secondary | ICD-10-CM | POA: Diagnosis not present

## 2013-05-16 DIAGNOSIS — I509 Heart failure, unspecified: Secondary | ICD-10-CM | POA: Diagnosis not present

## 2013-05-16 DIAGNOSIS — J9819 Other pulmonary collapse: Secondary | ICD-10-CM | POA: Diagnosis not present

## 2013-05-16 DIAGNOSIS — R072 Precordial pain: Secondary | ICD-10-CM | POA: Diagnosis not present

## 2013-05-16 DIAGNOSIS — J90 Pleural effusion, not elsewhere classified: Secondary | ICD-10-CM | POA: Diagnosis not present

## 2013-05-21 DIAGNOSIS — R0602 Shortness of breath: Secondary | ICD-10-CM | POA: Diagnosis not present

## 2013-05-21 DIAGNOSIS — R072 Precordial pain: Secondary | ICD-10-CM | POA: Diagnosis not present

## 2013-05-21 DIAGNOSIS — J90 Pleural effusion, not elsewhere classified: Secondary | ICD-10-CM | POA: Diagnosis not present

## 2013-05-21 DIAGNOSIS — Z79899 Other long term (current) drug therapy: Secondary | ICD-10-CM | POA: Diagnosis not present

## 2013-05-21 DIAGNOSIS — I509 Heart failure, unspecified: Secondary | ICD-10-CM | POA: Diagnosis not present

## 2013-05-21 DIAGNOSIS — D7589 Other specified diseases of blood and blood-forming organs: Secondary | ICD-10-CM | POA: Diagnosis not present

## 2013-05-21 DIAGNOSIS — R079 Chest pain, unspecified: Secondary | ICD-10-CM | POA: Diagnosis not present

## 2013-05-24 DIAGNOSIS — I4891 Unspecified atrial fibrillation: Secondary | ICD-10-CM | POA: Diagnosis not present

## 2013-05-24 DIAGNOSIS — R0609 Other forms of dyspnea: Secondary | ICD-10-CM | POA: Diagnosis not present

## 2013-05-24 DIAGNOSIS — R0989 Other specified symptoms and signs involving the circulatory and respiratory systems: Secondary | ICD-10-CM | POA: Diagnosis not present

## 2013-05-24 DIAGNOSIS — E785 Hyperlipidemia, unspecified: Secondary | ICD-10-CM | POA: Diagnosis not present

## 2013-05-28 DIAGNOSIS — I4891 Unspecified atrial fibrillation: Secondary | ICD-10-CM | POA: Diagnosis not present

## 2013-05-28 DIAGNOSIS — R7402 Elevation of levels of lactic acid dehydrogenase (LDH): Secondary | ICD-10-CM | POA: Diagnosis not present

## 2013-05-28 DIAGNOSIS — I509 Heart failure, unspecified: Secondary | ICD-10-CM | POA: Diagnosis not present

## 2013-05-28 DIAGNOSIS — Z79899 Other long term (current) drug therapy: Secondary | ICD-10-CM | POA: Diagnosis not present

## 2013-05-28 DIAGNOSIS — I7300 Raynaud's syndrome without gangrene: Secondary | ICD-10-CM | POA: Diagnosis not present

## 2013-05-28 DIAGNOSIS — R799 Abnormal finding of blood chemistry, unspecified: Secondary | ICD-10-CM | POA: Diagnosis not present

## 2013-05-28 DIAGNOSIS — J90 Pleural effusion, not elsewhere classified: Secondary | ICD-10-CM | POA: Diagnosis not present

## 2013-05-28 DIAGNOSIS — M069 Rheumatoid arthritis, unspecified: Secondary | ICD-10-CM | POA: Diagnosis not present

## 2013-05-29 DIAGNOSIS — I4891 Unspecified atrial fibrillation: Secondary | ICD-10-CM | POA: Diagnosis not present

## 2013-05-29 DIAGNOSIS — E785 Hyperlipidemia, unspecified: Secondary | ICD-10-CM | POA: Diagnosis not present

## 2013-05-29 DIAGNOSIS — I10 Essential (primary) hypertension: Secondary | ICD-10-CM | POA: Diagnosis not present

## 2013-06-04 DIAGNOSIS — I10 Essential (primary) hypertension: Secondary | ICD-10-CM | POA: Diagnosis not present

## 2013-06-04 DIAGNOSIS — R7989 Other specified abnormal findings of blood chemistry: Secondary | ICD-10-CM | POA: Diagnosis not present

## 2013-06-04 DIAGNOSIS — R609 Edema, unspecified: Secondary | ICD-10-CM | POA: Diagnosis not present

## 2013-06-14 DIAGNOSIS — I10 Essential (primary) hypertension: Secondary | ICD-10-CM | POA: Diagnosis not present

## 2013-06-14 DIAGNOSIS — N183 Chronic kidney disease, stage 3 unspecified: Secondary | ICD-10-CM | POA: Diagnosis not present

## 2013-06-14 DIAGNOSIS — R7989 Other specified abnormal findings of blood chemistry: Secondary | ICD-10-CM | POA: Diagnosis not present

## 2013-06-14 DIAGNOSIS — R609 Edema, unspecified: Secondary | ICD-10-CM | POA: Diagnosis not present

## 2013-06-18 DIAGNOSIS — R609 Edema, unspecified: Secondary | ICD-10-CM | POA: Diagnosis not present

## 2013-06-18 DIAGNOSIS — I4891 Unspecified atrial fibrillation: Secondary | ICD-10-CM | POA: Diagnosis not present

## 2013-06-18 DIAGNOSIS — E785 Hyperlipidemia, unspecified: Secondary | ICD-10-CM | POA: Diagnosis not present

## 2013-07-10 DIAGNOSIS — I319 Disease of pericardium, unspecified: Secondary | ICD-10-CM | POA: Diagnosis not present

## 2013-07-10 DIAGNOSIS — J90 Pleural effusion, not elsewhere classified: Secondary | ICD-10-CM | POA: Diagnosis not present

## 2013-07-10 DIAGNOSIS — M2550 Pain in unspecified joint: Secondary | ICD-10-CM | POA: Diagnosis not present

## 2013-07-10 DIAGNOSIS — R700 Elevated erythrocyte sedimentation rate: Secondary | ICD-10-CM | POA: Diagnosis not present

## 2013-07-16 DIAGNOSIS — I10 Essential (primary) hypertension: Secondary | ICD-10-CM | POA: Diagnosis not present

## 2013-07-16 DIAGNOSIS — M549 Dorsalgia, unspecified: Secondary | ICD-10-CM | POA: Diagnosis not present

## 2013-07-17 ENCOUNTER — Ambulatory Visit (INDEPENDENT_AMBULATORY_CARE_PROVIDER_SITE_OTHER): Payer: Medicare Other | Admitting: Cardiology

## 2013-07-17 ENCOUNTER — Encounter: Payer: Self-pay | Admitting: Cardiology

## 2013-07-17 VITALS — BP 143/97 | HR 79 | Ht 70.0 in | Wt 171.8 lb

## 2013-07-17 DIAGNOSIS — I10 Essential (primary) hypertension: Secondary | ICD-10-CM | POA: Diagnosis not present

## 2013-07-17 DIAGNOSIS — Z7901 Long term (current) use of anticoagulants: Secondary | ICD-10-CM

## 2013-07-17 DIAGNOSIS — I2510 Atherosclerotic heart disease of native coronary artery without angina pectoris: Secondary | ICD-10-CM | POA: Diagnosis not present

## 2013-07-17 DIAGNOSIS — I4891 Unspecified atrial fibrillation: Secondary | ICD-10-CM

## 2013-07-17 NOTE — Progress Notes (Signed)
[address], [place]  [address] Phone: [phone] Fax:  [phone]  Date:  07/17/2013   ID:  [name], DOB [birth year], MRN [id number]  PCP:  Henrine Screws, MD   History of Present Illness: [name] is a 80 y.o. male atrial fibrillation in the setting of pericardial effusion, subacute rib fractures, Dr. Inda Merlin is PCP, pacemaker. Treated with prednisone therapy. Has an internist as well as in [place]. On chronic anticoagulation. Echocardiogram showed stabilization of pericardial fluid. Resolution of pericardial effusion.  He has been doing well on anticoagulation. No bleeding, no syncope, no falls.  In regards to atrial fibrillation, he is asymptomatic. Remains in atrial fibrillation. ESR is trending downward and his prednisone dose is also being tapered by Dr. Lenna Gilford of rheumatology.  Over past 6 months, changes in medication. Recent ESR normal at Dr. Lenna Gilford. Had a period of NSR then back into AFIB. In Feb cancelled trip. Feels good now. Stomach is mildly upset. BM's at 3am at times. Last few days OK. Water aerobics.  Wt Readings from Last 3 Encounters:  07/17/13 171 lb 12.8 oz (77.928 kg)  01/14/13 178 lb (80.74 kg)  08/03/12 176 lb 2.4 oz (79.9 kg)     Past Medical History  Diagnosis Date  . Hypertension   . Arthritis   . Hearing decreased   . Coronary artery disease     mild, non-obstructive CAD by 2001 cath  . Pure hypercholesterolemia   . Allergic rhinitis   . Vitamin D deficiency   . Impacted cerumen   . Myalgia   . PVC (premature ventricular contraction)   . Hyponatremia   . Tachycardia   . Atrial fibrillation   . Spinal stenosis     with back pain - considering neurosurgery with Dr. Sherley Bounds, March, 2014  . RBBB     with left anterior hemiblock  . Renal insufficiency     stage III, creatinine 1.41, June, 2013 - Celebrex reduced  . Pleural effusion     August 2014  . Pericardial effusion     August  2014-repeat echocardiogram 12/11/12-no effusion    Past Surgical History  Procedure Laterality Date  . Joint replacement Bilateral     hip  . Inner ear surgery    . Lumbar laminectomy/decompression microdiscectomy N/A 08/08/2012    Procedure: LUMBAR FOUR TO FIVE, LUMBAR FIVE TO SACRAL ONE LUMBAR DECOMPRESSION;  Surgeon: Eustace Moore, MD;  Location: [facility];  Service: Neurosurgery;  Laterality: N/A;  LUMBAR LAMINECTOMY/DECOMPRESSION MICRODISCECTOMY 2 LEVELS  . Cataract extraction    . Hernia repair    . Cardiac catheterization       normal left main, LAD with 40-50% narrowing proximal LAD, diagonal vessel with 20-30% irregular lesion, left circumflex normal, right coronary artery normal, left ventricular function normal - Dr. Bea Laura    Current Outpatient Prescriptions  Medication Sig Dispense Refill  . cholecalciferol (VITAMIN D) 1000 UNITS tablet Take 1,000 Units by mouth daily.      . finasteride (PROSCAR) 5 MG tablet Take 5 mg by mouth at bedtime.      . metoprolol (LOPRESSOR) 50 MG tablet Take 50 mg by mouth daily.      . Multiple Vitamins-Minerals (MULTIVITAMIN WITH MINERALS) tablet Take 1 tablet by mouth daily.      . probenecid (BENEMID) 500 MG tablet Take 500 mg by mouth daily.      . Rivaroxaban (XARELTO) 15 MG TABS  tablet Take 15 mg by mouth daily.      . rosuvastatin (CRESTOR) 5 MG tablet Take 5 mg by mouth at bedtime.      Marland Kitchen terazosin (HYTRIN) 10 MG capsule Take 10 mg by mouth at bedtime.      . TRAMADOL HCL ER PO Take by mouth as needed.      . valsartan-hydrochlorothiazide (DIOVAN-HCT) 160-25 MG per tablet Take 0.5 tablets by mouth daily.      Marland Kitchen zolpidem (AMBIEN) 5 MG tablet Take 5 mg by mouth at bedtime as needed for sleep.       No current facility-administered medications for this visit.    Allergies:   No Known Allergies  Social History:  The patient  reports that he has never smoked. He does not have any smokeless tobacco history on file. He reports that  he drinks about .5 ounces of alcohol per week. He reports that he does not use illicit drugs.   ROS:  Please see the history of present illness.   No syncope, no bleeding, no orthopnea, no PND    PHYSICAL EXAM: VS:  BP 143/97  Pulse 79  Ht _0  (1.778 m)  Wt 171 lb 12.8 oz (77.928 kg)  BMI 24.65 kg/m2 Well nourished, well developed, in no acute distress HEENT: normal Neck: no JVD Cardiac: Irregularly irregular, no appreciable rubs, no murmur  Lungs:  clear to auscultation bilaterally, no wheezing, rhonchi or rales, caps mildly decreased breath sounds left base (chest x-ray reviewed, no effusion) Abd: soft, nontender, no hepatomegaly Ext: no edema Skin: warm and dry Neuro: no focal abnormalities noted  EKG:  Previously atrial fibrillation    ASSESSMENT AND PLAN:  1. Atrial fibrillation-we will continue with diltiazem as well as Xarelto. Lower dose was being utilized because of creatinine clearance.He is feeling much better, asymptomatic.Continue with rate control. No changes made. 2. Chronic anticoagulation-as above. Monitoring. Checking blood work every 6 months. 3. HTN - BP high today, but normal otherwise.  4. Inflammatory illness, pleural/pericardial effusion-resolved. 5. Coronary artery disease-nonobstructive, mild-2001 catheterization. 6. Rib fractures-healing well. Minor left upper chest pain. 7. We will see him back in 6 months.  Signed, Candee Furbish, MD [facility]  07/17/2013 1:57 PM

## 2013-07-17 NOTE — Patient Instructions (Signed)
Your physician recommends that you continue on your current medications as directed. Please refer to the Current Medication list given to you today.  Your physician wants you to follow-up in: 6 months with Dr. Skains. You will receive a reminder letter in the mail two months in advance. If you don't receive a letter, please call our office to schedule the follow-up appointment.  

## 2013-07-19 ENCOUNTER — Encounter: Payer: Self-pay | Admitting: Cardiology

## 2013-08-16 ENCOUNTER — Ambulatory Visit: Payer: Medicare Other | Admitting: Cardiology

## 2013-08-21 DIAGNOSIS — R7989 Other specified abnormal findings of blood chemistry: Secondary | ICD-10-CM | POA: Diagnosis not present

## 2013-08-21 DIAGNOSIS — N183 Chronic kidney disease, stage 3 unspecified: Secondary | ICD-10-CM | POA: Diagnosis not present

## 2013-08-26 DIAGNOSIS — L570 Actinic keratosis: Secondary | ICD-10-CM | POA: Diagnosis not present

## 2013-09-23 DIAGNOSIS — R197 Diarrhea, unspecified: Secondary | ICD-10-CM | POA: Diagnosis not present

## 2013-09-26 DIAGNOSIS — H6520 Chronic serous otitis media, unspecified ear: Secondary | ICD-10-CM | POA: Diagnosis not present

## 2013-09-26 DIAGNOSIS — H6980 Other specified disorders of Eustachian tube, unspecified ear: Secondary | ICD-10-CM | POA: Diagnosis not present

## 2013-09-26 DIAGNOSIS — H903 Sensorineural hearing loss, bilateral: Secondary | ICD-10-CM | POA: Diagnosis not present

## 2013-09-26 DIAGNOSIS — H905 Unspecified sensorineural hearing loss: Secondary | ICD-10-CM | POA: Diagnosis not present

## 2013-09-26 DIAGNOSIS — H906 Mixed conductive and sensorineural hearing loss, bilateral: Secondary | ICD-10-CM | POA: Diagnosis not present

## 2013-09-26 DIAGNOSIS — H7419 Adhesive middle ear disease, unspecified ear: Secondary | ICD-10-CM | POA: Diagnosis not present

## 2013-09-27 DIAGNOSIS — H6120 Impacted cerumen, unspecified ear: Secondary | ICD-10-CM | POA: Diagnosis not present

## 2013-10-09 DIAGNOSIS — L570 Actinic keratosis: Secondary | ICD-10-CM | POA: Diagnosis not present

## 2013-10-14 DIAGNOSIS — H903 Sensorineural hearing loss, bilateral: Secondary | ICD-10-CM | POA: Diagnosis not present

## 2013-10-14 DIAGNOSIS — H906 Mixed conductive and sensorineural hearing loss, bilateral: Secondary | ICD-10-CM | POA: Diagnosis not present

## 2013-10-14 DIAGNOSIS — H905 Unspecified sensorineural hearing loss: Secondary | ICD-10-CM | POA: Diagnosis not present

## 2013-10-14 DIAGNOSIS — H7419 Adhesive middle ear disease, unspecified ear: Secondary | ICD-10-CM | POA: Diagnosis not present

## 2013-10-14 DIAGNOSIS — H6980 Other specified disorders of Eustachian tube, unspecified ear: Secondary | ICD-10-CM | POA: Diagnosis not present

## 2013-10-15 DIAGNOSIS — Z86010 Personal history of colonic polyps: Secondary | ICD-10-CM | POA: Diagnosis not present

## 2013-10-15 DIAGNOSIS — K5730 Diverticulosis of large intestine without perforation or abscess without bleeding: Secondary | ICD-10-CM | POA: Diagnosis not present

## 2013-10-15 DIAGNOSIS — D126 Benign neoplasm of colon, unspecified: Secondary | ICD-10-CM | POA: Diagnosis not present

## 2013-10-16 ENCOUNTER — Encounter: Payer: Self-pay | Admitting: Cardiology

## 2013-11-18 DIAGNOSIS — E782 Mixed hyperlipidemia: Secondary | ICD-10-CM | POA: Diagnosis not present

## 2013-11-18 DIAGNOSIS — E559 Vitamin D deficiency, unspecified: Secondary | ICD-10-CM | POA: Diagnosis not present

## 2013-11-18 DIAGNOSIS — M109 Gout, unspecified: Secondary | ICD-10-CM | POA: Diagnosis not present

## 2013-11-18 DIAGNOSIS — I10 Essential (primary) hypertension: Secondary | ICD-10-CM | POA: Diagnosis not present

## 2013-11-19 DIAGNOSIS — Z1331 Encounter for screening for depression: Secondary | ICD-10-CM | POA: Diagnosis not present

## 2013-11-19 DIAGNOSIS — H903 Sensorineural hearing loss, bilateral: Secondary | ICD-10-CM | POA: Diagnosis not present

## 2013-11-19 DIAGNOSIS — Z0000 Encounter for general adult medical examination without abnormal findings: Secondary | ICD-10-CM | POA: Diagnosis not present

## 2013-11-19 DIAGNOSIS — H6980 Other specified disorders of Eustachian tube, unspecified ear: Secondary | ICD-10-CM | POA: Diagnosis not present

## 2013-11-19 DIAGNOSIS — R609 Edema, unspecified: Secondary | ICD-10-CM | POA: Diagnosis not present

## 2013-11-19 DIAGNOSIS — H7419 Adhesive middle ear disease, unspecified ear: Secondary | ICD-10-CM | POA: Diagnosis not present

## 2013-11-19 DIAGNOSIS — Z79899 Other long term (current) drug therapy: Secondary | ICD-10-CM | POA: Diagnosis not present

## 2013-11-19 DIAGNOSIS — R7989 Other specified abnormal findings of blood chemistry: Secondary | ICD-10-CM | POA: Diagnosis not present

## 2013-11-19 DIAGNOSIS — N400 Enlarged prostate without lower urinary tract symptoms: Secondary | ICD-10-CM | POA: Diagnosis not present

## 2013-11-19 DIAGNOSIS — Z23 Encounter for immunization: Secondary | ICD-10-CM | POA: Diagnosis not present

## 2013-11-19 DIAGNOSIS — H906 Mixed conductive and sensorineural hearing loss, bilateral: Secondary | ICD-10-CM | POA: Diagnosis not present

## 2013-11-19 DIAGNOSIS — I2510 Atherosclerotic heart disease of native coronary artery without angina pectoris: Secondary | ICD-10-CM | POA: Diagnosis not present

## 2013-11-19 DIAGNOSIS — H905 Unspecified sensorineural hearing loss: Secondary | ICD-10-CM | POA: Diagnosis not present

## 2013-12-13 DIAGNOSIS — R7989 Other specified abnormal findings of blood chemistry: Secondary | ICD-10-CM | POA: Diagnosis not present

## 2013-12-13 DIAGNOSIS — I10 Essential (primary) hypertension: Secondary | ICD-10-CM | POA: Diagnosis not present

## 2013-12-13 DIAGNOSIS — M545 Low back pain, unspecified: Secondary | ICD-10-CM | POA: Diagnosis not present

## 2013-12-13 DIAGNOSIS — N183 Chronic kidney disease, stage 3 unspecified: Secondary | ICD-10-CM | POA: Diagnosis not present

## 2013-12-13 DIAGNOSIS — IMO0002 Reserved for concepts with insufficient information to code with codable children: Secondary | ICD-10-CM | POA: Diagnosis not present

## 2013-12-13 DIAGNOSIS — Z23 Encounter for immunization: Secondary | ICD-10-CM | POA: Diagnosis not present

## 2013-12-13 DIAGNOSIS — R609 Edema, unspecified: Secondary | ICD-10-CM | POA: Diagnosis not present

## 2014-01-21 ENCOUNTER — Encounter: Payer: Self-pay | Admitting: Cardiology

## 2014-01-21 ENCOUNTER — Ambulatory Visit (INDEPENDENT_AMBULATORY_CARE_PROVIDER_SITE_OTHER): Payer: Medicare Other | Admitting: Cardiology

## 2014-01-21 VITALS — BP 130/82 | HR 71 | Ht 70.0 in | Wt 173.8 lb

## 2014-01-21 DIAGNOSIS — I2510 Atherosclerotic heart disease of native coronary artery without angina pectoris: Secondary | ICD-10-CM | POA: Diagnosis not present

## 2014-01-21 DIAGNOSIS — I10 Essential (primary) hypertension: Secondary | ICD-10-CM

## 2014-01-21 DIAGNOSIS — I481 Persistent atrial fibrillation: Secondary | ICD-10-CM

## 2014-01-21 DIAGNOSIS — I4819 Other persistent atrial fibrillation: Secondary | ICD-10-CM

## 2014-01-21 DIAGNOSIS — I2583 Coronary atherosclerosis due to lipid rich plaque: Secondary | ICD-10-CM

## 2014-01-21 DIAGNOSIS — Z7901 Long term (current) use of anticoagulants: Secondary | ICD-10-CM

## 2014-01-21 LAB — BASIC METABOLIC PANEL
BUN: 47 mg/dL — AB (ref 6–23)
CO2: 26 mEq/L (ref 19–32)
CREATININE: 1.6 mg/dL — AB (ref 0.4–1.5)
Calcium: 9.2 mg/dL (ref 8.4–10.5)
Chloride: 101 mEq/L (ref 96–112)
GFR: 42.59 mL/min — ABNORMAL LOW (ref >60.00)
GLUCOSE: 87 mg/dL (ref 70–99)
Potassium: 4.3 mEq/L (ref 3.5–5.1)
Sodium: 136 mEq/L (ref 135–145)

## 2014-01-21 LAB — BRAIN NATRIURETIC PEPTIDE: PRO B NATRI PEPTIDE: 151 pg/mL — AB (ref 0.0–100.0)

## 2014-01-21 NOTE — Addendum Note (Signed)
Addended by: Andres Ege on: 01/21/2014 04:57 PM   Modules accepted: Orders

## 2014-01-21 NOTE — Progress Notes (Signed)
[facility]. [address] [place], [place]  [address] Phone: [phone] Fax:  [phone]  Date:  01/21/2014   ID:  [name], DOB [birth year], MRN [id number]  PCP:  Henrine Screws, MD   History of Present Illness: [name] is a 80 y.o. male atrial fibrillation in the setting of pericardial effusion, subacute rib fractures, Dr. Inda Merlin is PCP, pacemaker. Treated with prednisone therapy. Has an internist as well as in [place]. On chronic anticoagulation. Echocardiogram showed resolution of pericardial effusion.  He has been doing well on anticoagulation. No bleeding, no syncope, no falls.  In regards to atrial fibrillation, he is asymptomatic. Remains in atrial fibrillation.   Prior ESR normal at Dr. Lenna Gilford. Had a period of NSR then back into AFIB. Feels good now.  Last few days OK. Water aerobics. Has appt in [place] on 57 and 1-2nd. Travels back and forth.   Wt Readings from Last 3 Encounters:  01/21/14 173 lb 12.8 oz (78.835 kg)  07/17/13 171 lb 12.8 oz (77.928 kg)  01/14/13 178 lb (80.74 kg)     Past Medical History  Diagnosis Date  . Hypertension   . Arthritis   . Hearing decreased   . Coronary artery disease     mild, non-obstructive CAD by 2001 cath  . Pure hypercholesterolemia   . Allergic rhinitis   . Vitamin D deficiency   . Impacted cerumen   . Myalgia   . PVC (premature ventricular contraction)   . Hyponatremia   . Tachycardia   . Atrial fibrillation   . Spinal stenosis     with back pain - considering neurosurgery with Dr. Sherley Bounds, March, 2014  . RBBB     with left anterior hemiblock  . Renal insufficiency     stage III, creatinine 1.41, June, 2013 - Celebrex reduced  . Pleural effusion     August 2014  . Pericardial effusion     August 2014-repeat echocardiogram 12/11/12-no effusion    Past Surgical History  Procedure Laterality Date  . Joint replacement Bilateral     hip  . Inner ear surgery    . Lumbar  laminectomy/decompression microdiscectomy N/A 08/08/2012    Procedure: LUMBAR FOUR TO FIVE, LUMBAR FIVE TO SACRAL ONE LUMBAR DECOMPRESSION;  Surgeon: Eustace Moore, MD;  Location: [facility];  Service: Neurosurgery;  Laterality: N/A;  LUMBAR LAMINECTOMY/DECOMPRESSION MICRODISCECTOMY 2 LEVELS  . Cataract extraction    . Hernia repair    . Cardiac catheterization       normal left main, LAD with 40-50% narrowing proximal LAD, diagonal vessel with 20-30% irregular lesion, left circumflex normal, right coronary artery normal, left ventricular function normal - Dr. Bea Laura    Current Outpatient Prescriptions  Medication Sig Dispense Refill  . ALPRAZolam (XANAX) 0.25 MG tablet Take 0.25 mg by mouth at bedtime as needed for anxiety.    . cholecalciferol (VITAMIN D) 1000 UNITS tablet Take 1,000 Units by mouth daily.    . finasteride (PROSCAR) 5 MG tablet Take 5 mg by mouth at bedtime.    . meloxicam (MOBIC) 15 MG tablet Take 15 mg by mouth 3 (three) times a week.    . metoprolol (LOPRESSOR) 50 MG tablet Take 50 mg by mouth daily.    . Multiple Vitamins-Minerals (MULTIVITAMIN WITH MINERALS) tablet Take 1 tablet by mouth daily.    . probenecid (BENEMID) 500 MG tablet Take 500 mg by mouth daily.    . Rivaroxaban (  XARELTO) 15 MG TABS tablet Take 15 mg by mouth daily.    . rosuvastatin (CRESTOR) 5 MG tablet Take 5 mg by mouth at bedtime.    Marland Kitchen terazosin (HYTRIN) 10 MG capsule Take 10 mg by mouth at bedtime.    . TRAMADOL HCL ER PO Take by mouth as needed.    . valsartan-hydrochlorothiazide (DIOVAN-HCT) 160-25 MG per tablet Take 0.5 tablets by mouth daily.    Marland Kitchen zolpidem (AMBIEN) 5 MG tablet Take 5 mg by mouth at bedtime as needed for sleep.     No current facility-administered medications for this visit.    Allergies:   No Known Allergies  Social History:  The patient  reports that he has never smoked. He does not have any smokeless tobacco history on file. He reports that he drinks about 0.5 oz  of alcohol per week. He reports that he does not use illicit drugs.   ROS:  Please see the history of present illness.   No syncope, no bleeding, no orthopnea, no PND    PHYSICAL EXAM: VS:  BP 130/82 mmHg  Pulse 71  Ht _0  (1.778 m)  Wt 173 lb 12.8 oz (78.835 kg)  BMI 24.94 kg/m2 Well nourished, well developed, in no acute distress HEENT: normal Neck: no JVD Cardiac: Irregularly irregular, no appreciable rubs, no murmur  Lungs:  clear to auscultation bilaterally, no wheezing, rhonchi or rales Abd: soft, nontender, no hepatomegaly Ext: no edema Skin: warm and dry Neuro: no focal abnormalities noted  EKG: 01/21/14-atrial fibrillation heart rate 71, right bundle branch block. Previously atrial fibrillation    ASSESSMENT AND PLAN:  1. Atrial fibrillation-we will continue with metoprolol as well as Xarelto. Lower dose was being utilized because of creatinine clearance. He is feeling much better, asymptomatic. Continue with rate control. No changes made. 2. Chronic anticoagulation-as above. Monitoring. Checking blood work every 6 months. 3. HTN - BP normal today, but normal otherwise.  4. RBBB - stable, no syncope. 5. Inflammatory illness, pleural/pericardial effusion-resolved. ESR now normal. 6. Coronary artery disease-nonobstructive, mild-2001 catheterization. 7. Rib fractures-resolved. Rare shoulder pain when swimming.  8. We will see him back in 6 months.  Signed, Candee Furbish, MD [facility]  01/21/2014 10:11 AM

## 2014-01-21 NOTE — Patient Instructions (Signed)
Continue medications as listed.  Please have blood work today (CBC and BMP)  Follow up in 6 months with Dr. Marlou Porch.  You will receive a letter in the mail 2 months before you are due.  Please call us when you receive this letter to schedule your follow up appointment.

## 2014-01-22 ENCOUNTER — Other Ambulatory Visit: Payer: Self-pay | Admitting: Cardiology

## 2014-01-22 ENCOUNTER — Encounter: Payer: Self-pay | Admitting: Cardiology

## 2014-01-22 DIAGNOSIS — E790 Hyperuricemia without signs of inflammatory arthritis and tophaceous disease: Secondary | ICD-10-CM | POA: Diagnosis not present

## 2014-01-22 DIAGNOSIS — M792 Neuralgia and neuritis, unspecified: Secondary | ICD-10-CM | POA: Diagnosis not present

## 2014-01-22 DIAGNOSIS — M2550 Pain in unspecified joint: Secondary | ICD-10-CM | POA: Diagnosis not present

## 2014-01-22 DIAGNOSIS — I10 Essential (primary) hypertension: Secondary | ICD-10-CM | POA: Diagnosis not present

## 2014-01-22 DIAGNOSIS — R700 Elevated erythrocyte sedimentation rate: Secondary | ICD-10-CM | POA: Diagnosis not present

## 2014-01-22 DIAGNOSIS — I319 Disease of pericardium, unspecified: Secondary | ICD-10-CM | POA: Diagnosis not present

## 2014-01-22 DIAGNOSIS — I4891 Unspecified atrial fibrillation: Secondary | ICD-10-CM | POA: Diagnosis not present

## 2014-01-22 DIAGNOSIS — N183 Chronic kidney disease, stage 3 (moderate): Secondary | ICD-10-CM | POA: Diagnosis not present

## 2014-01-25 ENCOUNTER — Other Ambulatory Visit: Payer: Self-pay | Admitting: Cardiology

## 2014-01-29 ENCOUNTER — Other Ambulatory Visit: Payer: Self-pay | Admitting: Cardiology

## 2014-02-06 ENCOUNTER — Telehealth: Payer: Self-pay | Admitting: *Deleted

## 2014-02-06 NOTE — Telephone Encounter (Signed)
Discussed with [name] the need to come in for a re-draw for the CBC since the lab could not do it as an add on. He is okay with this, and will come in on February 18, 2014

## 2014-02-18 ENCOUNTER — Other Ambulatory Visit: Payer: Medicare Other

## 2014-02-19 ENCOUNTER — Other Ambulatory Visit (INDEPENDENT_AMBULATORY_CARE_PROVIDER_SITE_OTHER): Payer: Medicare Other | Admitting: *Deleted

## 2014-02-19 DIAGNOSIS — I481 Persistent atrial fibrillation: Secondary | ICD-10-CM

## 2014-02-19 DIAGNOSIS — I2510 Atherosclerotic heart disease of native coronary artery without angina pectoris: Secondary | ICD-10-CM | POA: Diagnosis not present

## 2014-02-19 DIAGNOSIS — Z7901 Long term (current) use of anticoagulants: Secondary | ICD-10-CM

## 2014-02-19 DIAGNOSIS — I4819 Other persistent atrial fibrillation: Secondary | ICD-10-CM

## 2014-02-19 DIAGNOSIS — R351 Nocturia: Secondary | ICD-10-CM | POA: Diagnosis not present

## 2014-02-19 DIAGNOSIS — I10 Essential (primary) hypertension: Secondary | ICD-10-CM | POA: Diagnosis not present

## 2014-02-19 DIAGNOSIS — I2583 Coronary atherosclerosis due to lipid rich plaque: Secondary | ICD-10-CM

## 2014-02-19 DIAGNOSIS — D495 Neoplasm of unspecified behavior of other genitourinary organs: Secondary | ICD-10-CM | POA: Diagnosis not present

## 2014-02-19 LAB — CBC WITH DIFFERENTIAL/PLATELET
BASOS ABS: 0 10*3/uL (ref 0.0–0.1)
Basophils Relative: 0.6 % (ref 0.0–3.0)
Eosinophils Absolute: 0.1 10*3/uL (ref 0.0–0.7)
Eosinophils Relative: 2.3 % (ref 0.0–5.0)
HEMATOCRIT: 41.5 % (ref 39.0–52.0)
Hemoglobin: 13.8 g/dL (ref 13.0–17.0)
LYMPHS ABS: 1.1 10*3/uL (ref 0.7–4.0)
LYMPHS PCT: 20.1 % (ref 12.0–46.0)
MCHC: 33.3 g/dL (ref 30.0–36.0)
MCV: 101.4 fl — ABNORMAL HIGH (ref 78.0–100.0)
MONOS PCT: 13 % — AB (ref 3.0–12.0)
Monocytes Absolute: 0.7 10*3/uL (ref 0.1–1.0)
NEUTROS ABS: 3.6 10*3/uL (ref 1.4–7.7)
Neutrophils Relative %: 64 % (ref 43.0–77.0)
PLATELETS: 143 10*3/uL — AB (ref 150.0–400.0)
RBC: 4.09 Mil/uL — ABNORMAL LOW (ref 4.22–5.81)
RDW: 14.4 % (ref 11.5–15.5)
WBC: 5.6 10*3/uL (ref 4.0–10.5)

## 2014-02-20 ENCOUNTER — Encounter: Payer: Self-pay | Admitting: Cardiology

## 2014-02-20 DIAGNOSIS — Z961 Presence of intraocular lens: Secondary | ICD-10-CM | POA: Diagnosis not present

## 2014-02-26 DIAGNOSIS — N401 Enlarged prostate with lower urinary tract symptoms: Secondary | ICD-10-CM | POA: Diagnosis not present

## 2014-02-26 DIAGNOSIS — Z96641 Presence of right artificial hip joint: Secondary | ICD-10-CM | POA: Diagnosis not present

## 2014-02-26 DIAGNOSIS — R3916 Straining to void: Secondary | ICD-10-CM | POA: Diagnosis not present

## 2014-02-26 DIAGNOSIS — R351 Nocturia: Secondary | ICD-10-CM | POA: Diagnosis not present

## 2014-02-26 DIAGNOSIS — Z471 Aftercare following joint replacement surgery: Secondary | ICD-10-CM | POA: Diagnosis not present

## 2014-02-26 DIAGNOSIS — M25562 Pain in left knee: Secondary | ICD-10-CM | POA: Diagnosis not present

## 2014-02-26 DIAGNOSIS — M25561 Pain in right knee: Secondary | ICD-10-CM | POA: Diagnosis not present

## 2014-02-26 DIAGNOSIS — Z96642 Presence of left artificial hip joint: Secondary | ICD-10-CM | POA: Diagnosis not present

## 2014-03-06 DIAGNOSIS — E785 Hyperlipidemia, unspecified: Secondary | ICD-10-CM | POA: Diagnosis not present

## 2014-03-06 DIAGNOSIS — R609 Edema, unspecified: Secondary | ICD-10-CM | POA: Diagnosis not present

## 2014-03-06 DIAGNOSIS — I4891 Unspecified atrial fibrillation: Secondary | ICD-10-CM | POA: Diagnosis not present

## 2014-03-11 DIAGNOSIS — Z79899 Other long term (current) drug therapy: Secondary | ICD-10-CM | POA: Diagnosis not present

## 2014-03-11 DIAGNOSIS — I10 Essential (primary) hypertension: Secondary | ICD-10-CM | POA: Diagnosis not present

## 2014-03-24 DIAGNOSIS — L739 Follicular disorder, unspecified: Secondary | ICD-10-CM | POA: Diagnosis not present

## 2014-03-24 DIAGNOSIS — L570 Actinic keratosis: Secondary | ICD-10-CM | POA: Diagnosis not present

## 2014-03-27 DIAGNOSIS — R350 Frequency of micturition: Secondary | ICD-10-CM | POA: Diagnosis not present

## 2014-03-27 DIAGNOSIS — Z79899 Other long term (current) drug therapy: Secondary | ICD-10-CM | POA: Diagnosis not present

## 2014-03-27 DIAGNOSIS — S61315A Laceration without foreign body of left ring finger with damage to nail, initial encounter: Secondary | ICD-10-CM | POA: Diagnosis not present

## 2014-03-27 DIAGNOSIS — I10 Essential (primary) hypertension: Secondary | ICD-10-CM | POA: Diagnosis not present

## 2014-03-27 DIAGNOSIS — N400 Enlarged prostate without lower urinary tract symptoms: Secondary | ICD-10-CM | POA: Diagnosis not present

## 2014-03-31 DIAGNOSIS — I10 Essential (primary) hypertension: Secondary | ICD-10-CM | POA: Diagnosis not present

## 2014-03-31 DIAGNOSIS — Z23 Encounter for immunization: Secondary | ICD-10-CM | POA: Diagnosis not present

## 2014-03-31 DIAGNOSIS — Z79899 Other long term (current) drug therapy: Secondary | ICD-10-CM | POA: Diagnosis not present

## 2014-03-31 DIAGNOSIS — R350 Frequency of micturition: Secondary | ICD-10-CM | POA: Diagnosis not present

## 2014-04-07 DIAGNOSIS — E790 Hyperuricemia without signs of inflammatory arthritis and tophaceous disease: Secondary | ICD-10-CM | POA: Diagnosis not present

## 2014-04-07 DIAGNOSIS — I10 Essential (primary) hypertension: Secondary | ICD-10-CM | POA: Diagnosis not present

## 2014-04-07 DIAGNOSIS — N183 Chronic kidney disease, stage 3 (moderate): Secondary | ICD-10-CM | POA: Diagnosis not present

## 2014-04-07 DIAGNOSIS — S61412A Laceration without foreign body of left hand, initial encounter: Secondary | ICD-10-CM | POA: Diagnosis not present

## 2014-04-07 DIAGNOSIS — I4891 Unspecified atrial fibrillation: Secondary | ICD-10-CM | POA: Diagnosis not present

## 2014-04-29 DIAGNOSIS — L309 Dermatitis, unspecified: Secondary | ICD-10-CM | POA: Diagnosis not present

## 2014-04-29 DIAGNOSIS — L570 Actinic keratosis: Secondary | ICD-10-CM | POA: Diagnosis not present

## 2014-05-01 DIAGNOSIS — I480 Paroxysmal atrial fibrillation: Secondary | ICD-10-CM | POA: Diagnosis not present

## 2014-05-01 DIAGNOSIS — R079 Chest pain, unspecified: Secondary | ICD-10-CM | POA: Diagnosis not present

## 2014-05-12 DIAGNOSIS — H906 Mixed conductive and sensorineural hearing loss, bilateral: Secondary | ICD-10-CM | POA: Diagnosis not present

## 2014-05-12 DIAGNOSIS — H6983 Other specified disorders of Eustachian tube, bilateral: Secondary | ICD-10-CM | POA: Diagnosis not present

## 2014-05-12 DIAGNOSIS — H7411 Adhesive right middle ear disease: Secondary | ICD-10-CM | POA: Diagnosis not present

## 2014-05-12 DIAGNOSIS — H6521 Chronic serous otitis media, right ear: Secondary | ICD-10-CM | POA: Diagnosis not present

## 2014-06-10 DIAGNOSIS — N401 Enlarged prostate with lower urinary tract symptoms: Secondary | ICD-10-CM | POA: Diagnosis not present

## 2014-06-16 DIAGNOSIS — L570 Actinic keratosis: Secondary | ICD-10-CM | POA: Diagnosis not present

## 2014-06-30 DIAGNOSIS — R609 Edema, unspecified: Secondary | ICD-10-CM | POA: Diagnosis not present

## 2014-06-30 DIAGNOSIS — I4891 Unspecified atrial fibrillation: Secondary | ICD-10-CM | POA: Diagnosis not present

## 2014-06-30 DIAGNOSIS — E785 Hyperlipidemia, unspecified: Secondary | ICD-10-CM | POA: Diagnosis not present

## 2014-07-02 DIAGNOSIS — Z79899 Other long term (current) drug therapy: Secondary | ICD-10-CM | POA: Diagnosis not present

## 2014-07-02 DIAGNOSIS — R350 Frequency of micturition: Secondary | ICD-10-CM | POA: Diagnosis not present

## 2014-07-02 DIAGNOSIS — I10 Essential (primary) hypertension: Secondary | ICD-10-CM | POA: Diagnosis not present

## 2014-07-02 DIAGNOSIS — N400 Enlarged prostate without lower urinary tract symptoms: Secondary | ICD-10-CM | POA: Diagnosis not present

## 2014-07-09 DIAGNOSIS — Z79899 Other long term (current) drug therapy: Secondary | ICD-10-CM | POA: Diagnosis not present

## 2014-07-09 DIAGNOSIS — I10 Essential (primary) hypertension: Secondary | ICD-10-CM | POA: Diagnosis not present

## 2014-07-09 DIAGNOSIS — R350 Frequency of micturition: Secondary | ICD-10-CM | POA: Diagnosis not present

## 2014-07-09 DIAGNOSIS — N400 Enlarged prostate without lower urinary tract symptoms: Secondary | ICD-10-CM | POA: Diagnosis not present

## 2014-07-22 DIAGNOSIS — I10 Essential (primary) hypertension: Secondary | ICD-10-CM | POA: Diagnosis not present

## 2014-07-22 DIAGNOSIS — E790 Hyperuricemia without signs of inflammatory arthritis and tophaceous disease: Secondary | ICD-10-CM | POA: Diagnosis not present

## 2014-07-22 DIAGNOSIS — N183 Chronic kidney disease, stage 3 (moderate): Secondary | ICD-10-CM | POA: Diagnosis not present

## 2014-07-22 DIAGNOSIS — I4891 Unspecified atrial fibrillation: Secondary | ICD-10-CM | POA: Diagnosis not present

## 2014-08-08 ENCOUNTER — Ambulatory Visit: Payer: Medicare Other | Admitting: Cardiology

## 2014-08-11 DIAGNOSIS — L570 Actinic keratosis: Secondary | ICD-10-CM | POA: Diagnosis not present

## 2014-08-21 DIAGNOSIS — I10 Essential (primary) hypertension: Secondary | ICD-10-CM | POA: Diagnosis not present

## 2014-08-22 IMAGING — CT CT CHEST W/O CM
3 of 4 series · 17 of 30 positions shown, 19 images · non-contrast
Comparison: Chest x-ray dated 11/22/2012

CLINICAL DATA: Left lower anterior chest pain.  The patient fell 10
days ago.  Bilateral pleural effusions.

CT CHEST WITHOUT CONTRAST Contrast was not utilized because of
impaired renal function.
TECHNIQUE: Multidetector CT imaging of the chest was performed
following the standard protocol without IV contrast.]

[Series 3: chest w/o · axial · non-contrast · 0.70mm/px · z∈[-317,-87]mm · 5 of 70 slices shown, 7 images]
[im 12/70  mediastinal]
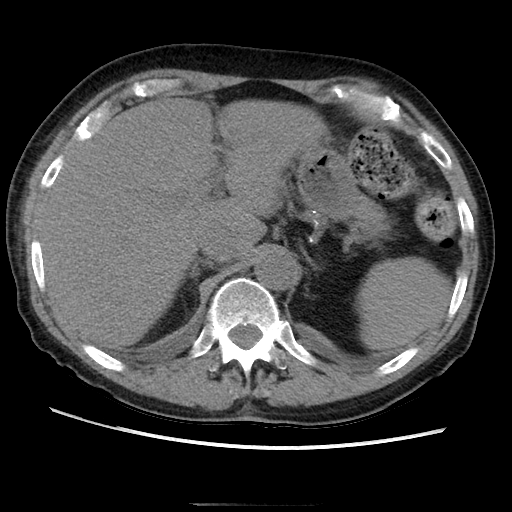
[im 12/70  lung]
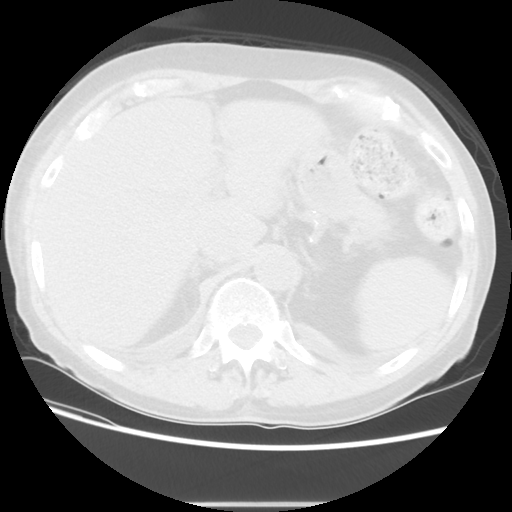
[im 24/70  lung]
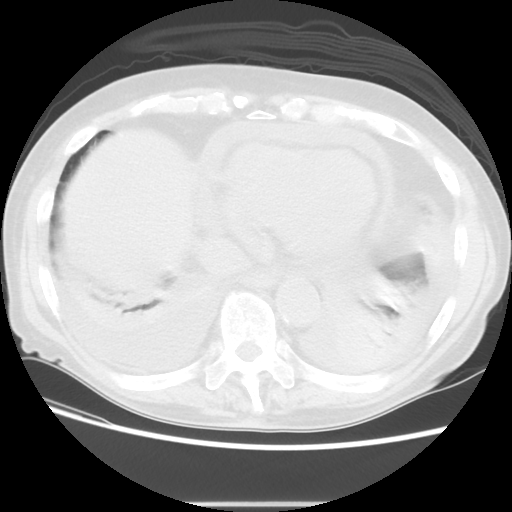
[im 35/70  lung]
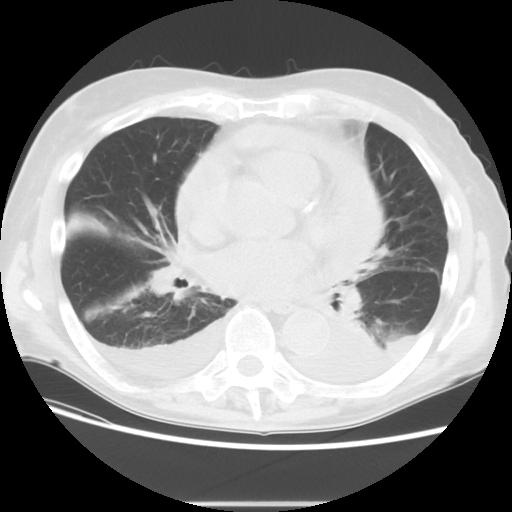
[im 47/70  lung]
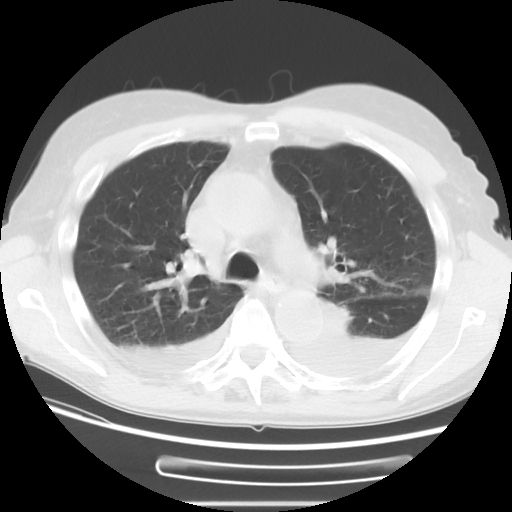
[im 58/70  mediastinal]
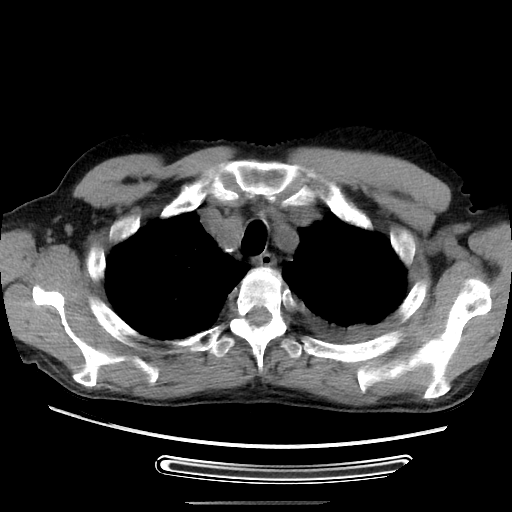
[im 58/70  lung]
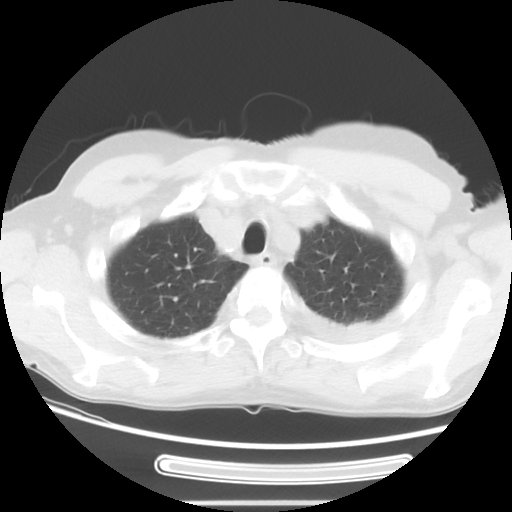

[Series 4: lung windows · axial · 0.70mm/px · z∈[-317,-82]mm · 5 of 71 slices shown]
[im 12/71  lung]
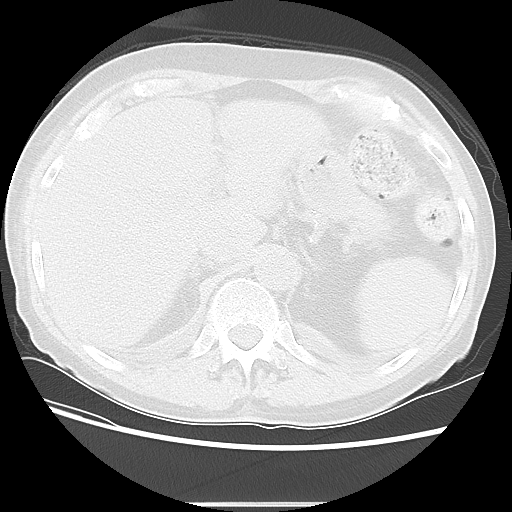
[im 24/71  lung]
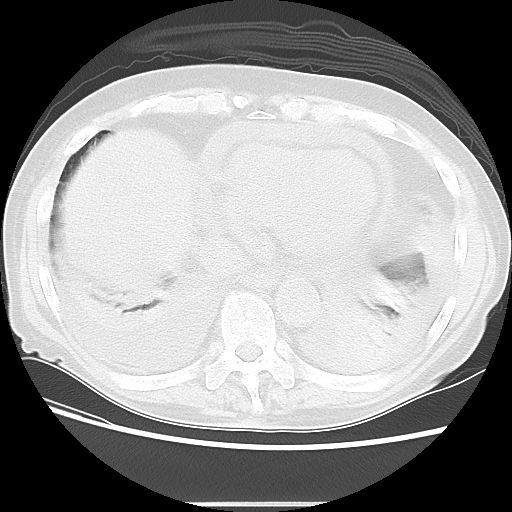
[im 36/71  lung]
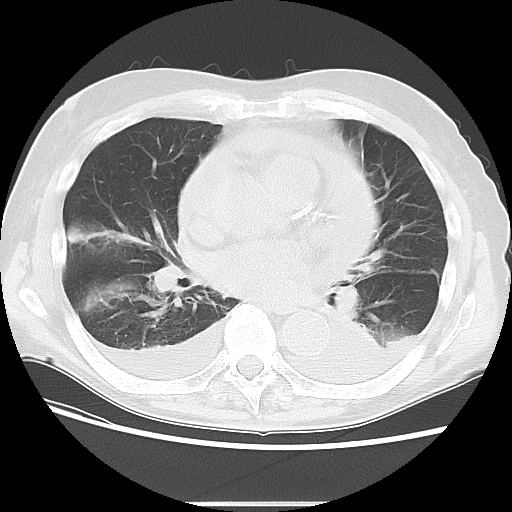
[im 47/71  lung]
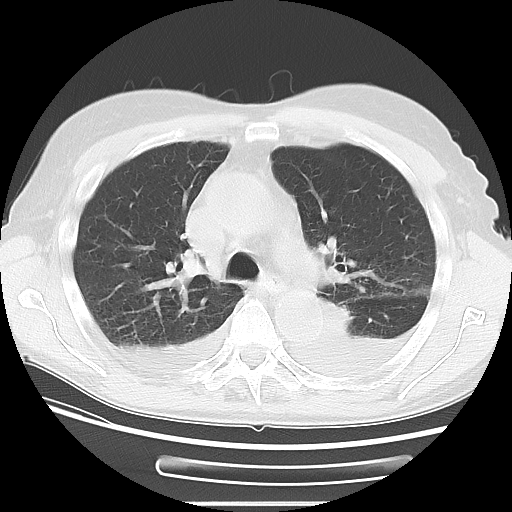
[im 59/71  lung]
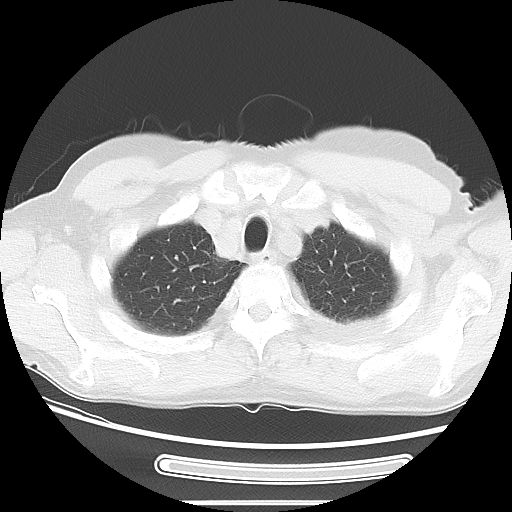

[Series 602: sagittal body · sagittal · 0.70mm/px · 7 of 145 slices shown]
[im 12/145  mediastinal]
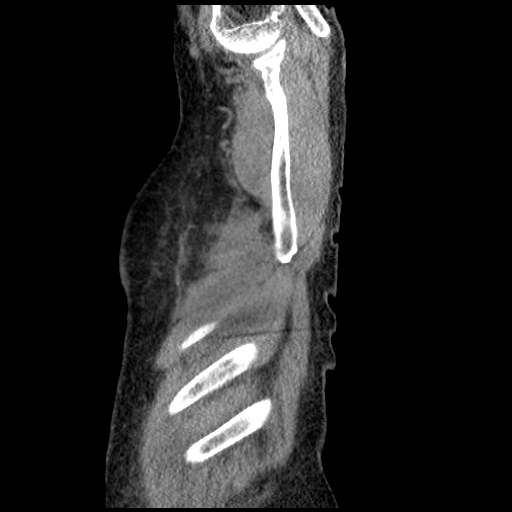
[im 34/145  mediastinal]
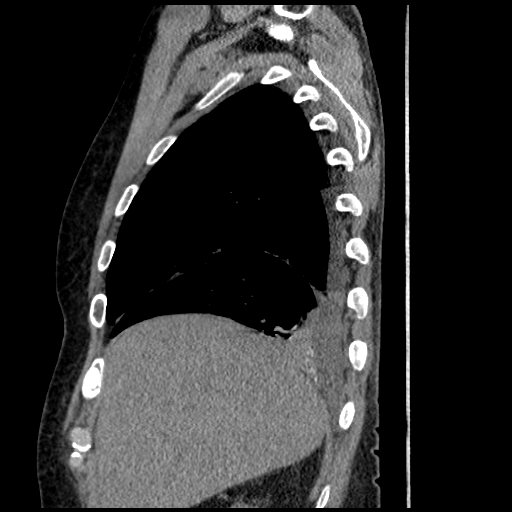
[im 45/145  mediastinal]
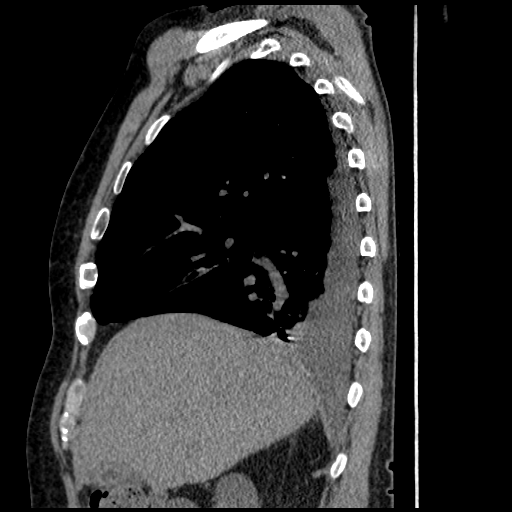
[im 67/145  mediastinal]
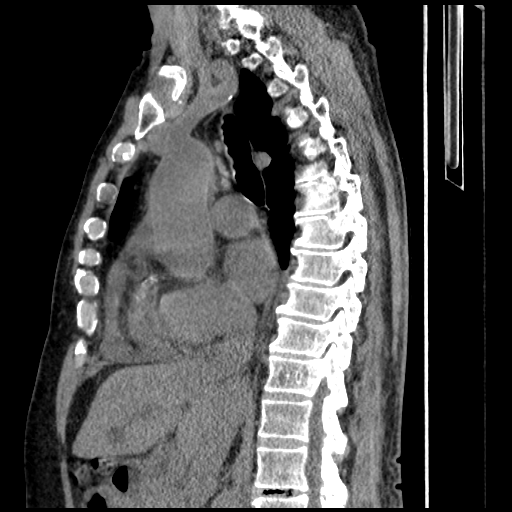
[im 78/145  mediastinal]
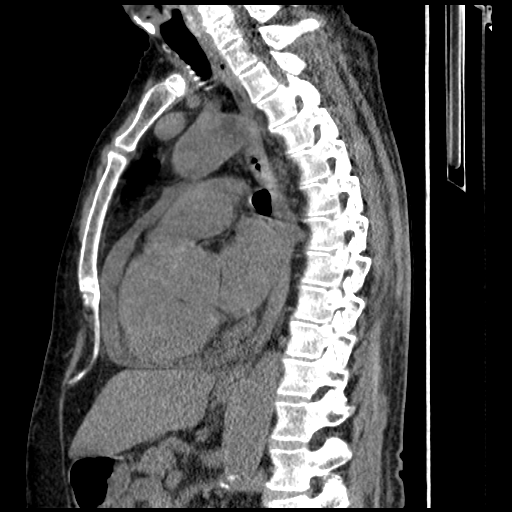
[im 100/145  mediastinal]
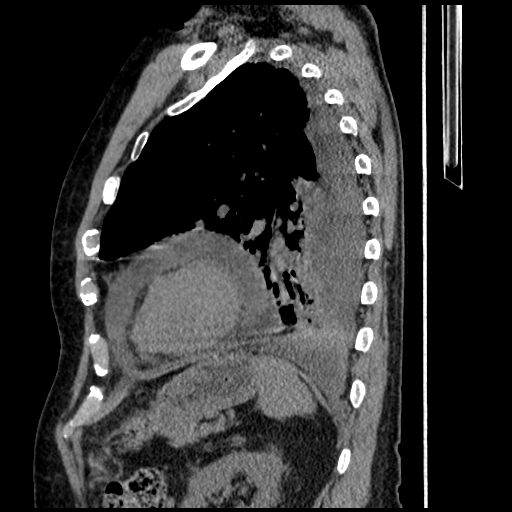
[im 111/145  mediastinal]
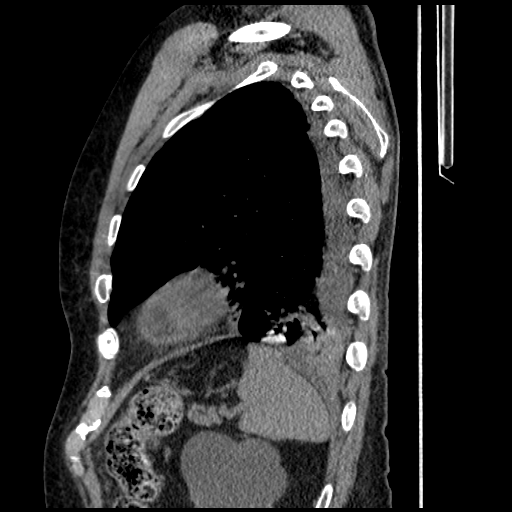

[17 of 30 positions shown; findings below may reference images not displayed]

FINDINGS: There is a prominent pericardial effusion.  Maximum
thickness of the effusion is 2 cm.  There are moderate bilateral
pleural effusions with secondary slight atelectasis in both lower
lobes and in the lingula of the left upper lobe.

There are fractures of the anterior aspects of the right third,
fourth and fifth ribs with minimal displacement.  No pneumothorax.
Pulmonary vascularity is normal.  Heart size is normal. There is
new calcification or a stent in the left coronary artery.

There is no adenopathy or mass lesion.

The visualized portion of the upper abdomen demonstrates a 7.8 cm
cyst on the left kidney and a 3 cm cyst on the upper pole of the
right kidney.  These were present on the prior MRI of the lumbar
spine dated 01/18/2005.  There is a 17 mm of low density lesion in
the left lobe of the liver, indeterminate.
IMPRESSION: 1.  Prominent pericardial effusion.
2.  Moderate bilateral pleural effusions with secondary compressive
atelectasis at both lung bases.  No lung masses or adenopathy.
3.  Acute fractures of the anterior aspects of the right third,
fourth and fifth ribs at the costochondral junctions.

Critical Value/emergent results were called by telephone at the
time of interpretation on 11/23/2012 at [DATE] p.m. to Dr. Ameng, who
verbally acknowledged these results.

## 2014-08-25 ENCOUNTER — Ambulatory Visit (INDEPENDENT_AMBULATORY_CARE_PROVIDER_SITE_OTHER): Payer: Medicare Other | Admitting: Cardiology

## 2014-08-25 ENCOUNTER — Encounter: Payer: Self-pay | Admitting: Cardiology

## 2014-08-25 VITALS — BP 126/78 | HR 68 | Ht 70.0 in | Wt 170.0 lb

## 2014-08-25 DIAGNOSIS — I2510 Atherosclerotic heart disease of native coronary artery without angina pectoris: Secondary | ICD-10-CM | POA: Diagnosis not present

## 2014-08-25 DIAGNOSIS — I4510 Unspecified right bundle-branch block: Secondary | ICD-10-CM | POA: Diagnosis not present

## 2014-08-25 DIAGNOSIS — Z7901 Long term (current) use of anticoagulants: Secondary | ICD-10-CM

## 2014-08-25 DIAGNOSIS — I481 Persistent atrial fibrillation: Secondary | ICD-10-CM | POA: Diagnosis not present

## 2014-08-25 DIAGNOSIS — I4819 Other persistent atrial fibrillation: Secondary | ICD-10-CM

## 2014-08-25 DIAGNOSIS — I2583 Coronary atherosclerosis due to lipid rich plaque: Secondary | ICD-10-CM

## 2014-08-25 NOTE — Patient Instructions (Signed)
Medication Instructions:  Your physician recommends that you continue on your current medications as directed. Please refer to the Current Medication list given to you today.  Follow-Up: Follow up in 6 months with Dr. Skains.  You will receive a letter in the mail 2 months before you are due.  Please call us when you receive this letter to schedule your follow up appointment.  Thank you for choosing [facility]!!     

## 2014-08-25 NOTE — Progress Notes (Signed)
[name]. [address] [place], [place]  [address] Phone: [phone] Fax:  [phone]  Date:  08/25/2014   ID:  [name], DOB 09/19/1926, MRN [id number]  PCP:  Henrine Screws, MD   History of Present Illness: [name] is a 81 y.o. male atrial fibrillation in the setting of pericardial effusion, subacute rib fractures, Dr. Inda Merlin is PCP, pacemaker. Treated with prednisone therapy. Has an internist as well as in [place]. On chronic anticoagulation. Echocardiogram showed resolution of pericardial effusion.  He has been doing well on anticoagulation. No bleeding, no syncope, no falls.  In regards to atrial fibrillation, he is asymptomatic. Remains in atrial fibrillation.   Prior ESR normal at Dr. Lenna Gilford. Had a period of NSR then back into AFIB. Feels good now.  Last few days OK. Water aerobics. Has appt in [place] on 57 and 1-2nd. Travels back and forth.   08/25/14-overall doing well, no syncope, no bleeding, no orthopnea, no PND. He does feel some dyspnea when walking up stairs or walking uphill.  Wt Readings from Last 3 Encounters:  08/25/14 170 lb (77.111 kg)  01/21/14 173 lb 12.8 oz (78.835 kg)  07/17/13 171 lb 12.8 oz (77.928 kg)     Past Medical History  Diagnosis Date  . Hypertension   . Arthritis   . Hearing decreased   . Coronary artery disease     mild, non-obstructive CAD by 2001 cath  . Pure hypercholesterolemia   . Allergic rhinitis   . Vitamin D deficiency   . Impacted cerumen   . Myalgia   . PVC (premature ventricular contraction)   . Hyponatremia   . Tachycardia   . Atrial fibrillation   . Spinal stenosis     with back pain - considering neurosurgery with Dr. Sherley Bounds, March, 2014  . RBBB     with left anterior hemiblock  . Renal insufficiency     stage III, creatinine 1.41, June, 2013 - Celebrex reduced  . Pleural effusion     August 2014  . Pericardial effusion     August 2014-repeat echocardiogram 12/11/12-no  effusion    Past Surgical History  Procedure Laterality Date  . Joint replacement Bilateral     hip  . Inner ear surgery    . Lumbar laminectomy/decompression microdiscectomy N/A 08/08/2012    Procedure: LUMBAR FOUR TO FIVE, LUMBAR FIVE TO SACRAL ONE LUMBAR DECOMPRESSION;  Surgeon: Eustace Moore, MD;  Location: [facility];  Service: Neurosurgery;  Laterality: N/A;  LUMBAR LAMINECTOMY/DECOMPRESSION MICRODISCECTOMY 2 LEVELS  . Cataract extraction    . Hernia repair    . Cardiac catheterization       normal left main, LAD with 40-50% narrowing proximal LAD, diagonal vessel with 20-30% irregular lesion, left circumflex normal, right coronary artery normal, left ventricular function normal - Dr. Bea Laura    Current Outpatient Prescriptions  Medication Sig Dispense Refill  . ALPRAZolam (XANAX) 0.25 MG tablet Take 0.25 mg by mouth at bedtime as needed for anxiety.    Marland Kitchen amLODipine (NORVASC) 5 MG tablet Take 5 mg by mouth daily.    . cholecalciferol (VITAMIN D) 1000 UNITS tablet Take 1,000 Units by mouth daily.    . finasteride (PROSCAR) 5 MG tablet Take 5 mg by mouth at bedtime.    . metoprolol succinate (TOPROL-XL) 50 MG 24 hr tablet Take 50 mg by mouth daily. Take with or immediately following a meal.    . Multiple Vitamins-Minerals (  MULTIVITAMIN WITH MINERALS) tablet Take 1 tablet by mouth daily.    . probenecid (BENEMID) 500 MG tablet Take 500 mg by mouth daily.    . rosuvastatin (CRESTOR) 5 MG tablet Take 5 mg by mouth at bedtime.    Marland Kitchen terazosin (HYTRIN) 10 MG capsule Take 10 mg by mouth at bedtime.    . TRAMADOL HCL ER PO Take by mouth as needed.    . valsartan-hydrochlorothiazide (DIOVAN-HCT) 160-25 MG per tablet Take 0.5 tablets by mouth daily.    Alveda Reasons 15 MG TABS tablet TAKE 1 TABLET ONCE DAILY. 60 tablet 6  . zolpidem (AMBIEN) 5 MG tablet Take 5 mg by mouth at bedtime as needed for sleep.     No current facility-administered medications for this visit.    Allergies:   No  Known Allergies  Social History:  The patient  reports that he has never smoked. He does not have any smokeless tobacco history on file. He reports that he drinks about 0.5 oz of alcohol per week. He reports that he does not use illicit drugs.   ROS:  Please see the history of present illness.   No syncope, no bleeding, no orthopnea, no PND    PHYSICAL EXAM: VS:  BP 126/78 mmHg  Pulse 68  Ht _0  (1.778 m)  Wt 170 lb (77.111 kg)  BMI 24.39 kg/m2 Well nourished, well developed, in no acute distress HEENT: normal Neck: no JVD Cardiac: Irregularly irregular, no appreciable rubs, no murmur  Lungs:  clear to auscultation bilaterally, no wheezing, rhonchi or rales Abd: soft, nontender, no hepatomegaly Ext: no edema Skin: warm and dry Neuro: no focal abnormalities noted  EKG: 01/21/14-atrial fibrillation heart rate 71, right bundle branch block. Previously atrial fibrillation    ASSESSMENT AND PLAN:  1. Atrial fibrillation-we will continue with metoprolol as well as Xarelto. Lower dose was being utilized because of creatinine clearance. He is feeling much better, asymptomatic. Continue with rate control. No changes made. Echocardiogram performed in [place] showed EF of 55-60%. Right ventricle was normal. Moderate left atrial enlargement. Report has been scanned. 2. Chronic anticoagulation-as above. Monitoring. Checking blood work every 6 months. Dr. Inda Merlin. 3. HTN - BP normal today, but normal otherwise.  4. RBBB - stable, no syncope. 5. Inflammatory illness, pleural/pericardial effusion-resolved. ESR now normal. 6. Coronary artery disease-nonobstructive, mild-2001 catheterization. Exercise treadmill test showed increased exercise capacity. No ischemic changes. 7. Rib fractures-resolved. Rare shoulder pain when swimming.  8. We will see him back in 6 months.  Signed, Candee Furbish, MD [facility]  08/25/2014 3:30 PM

## 2014-08-27 DIAGNOSIS — H6983 Other specified disorders of Eustachian tube, bilateral: Secondary | ICD-10-CM | POA: Diagnosis not present

## 2014-08-27 DIAGNOSIS — H906 Mixed conductive and sensorineural hearing loss, bilateral: Secondary | ICD-10-CM | POA: Diagnosis not present

## 2014-08-27 DIAGNOSIS — H6523 Chronic serous otitis media, bilateral: Secondary | ICD-10-CM | POA: Diagnosis not present

## 2014-08-27 DIAGNOSIS — H7411 Adhesive right middle ear disease: Secondary | ICD-10-CM | POA: Diagnosis not present

## 2014-08-29 DIAGNOSIS — H906 Mixed conductive and sensorineural hearing loss, bilateral: Secondary | ICD-10-CM | POA: Diagnosis not present

## 2014-08-29 DIAGNOSIS — H7411 Adhesive right middle ear disease: Secondary | ICD-10-CM | POA: Diagnosis not present

## 2014-08-29 DIAGNOSIS — H6523 Chronic serous otitis media, bilateral: Secondary | ICD-10-CM | POA: Diagnosis not present

## 2014-08-29 DIAGNOSIS — H6983 Other specified disorders of Eustachian tube, bilateral: Secondary | ICD-10-CM | POA: Diagnosis not present

## 2014-09-01 DIAGNOSIS — R609 Edema, unspecified: Secondary | ICD-10-CM | POA: Diagnosis not present

## 2014-09-01 DIAGNOSIS — I4891 Unspecified atrial fibrillation: Secondary | ICD-10-CM | POA: Diagnosis not present

## 2014-09-12 DIAGNOSIS — N183 Chronic kidney disease, stage 3 (moderate): Secondary | ICD-10-CM | POA: Diagnosis not present

## 2014-09-12 DIAGNOSIS — I10 Essential (primary) hypertension: Secondary | ICD-10-CM | POA: Diagnosis not present

## 2014-09-12 DIAGNOSIS — R351 Nocturia: Secondary | ICD-10-CM | POA: Diagnosis not present

## 2014-09-12 DIAGNOSIS — R0982 Postnasal drip: Secondary | ICD-10-CM | POA: Diagnosis not present

## 2014-09-15 DIAGNOSIS — H6523 Chronic serous otitis media, bilateral: Secondary | ICD-10-CM | POA: Diagnosis not present

## 2014-09-15 DIAGNOSIS — H906 Mixed conductive and sensorineural hearing loss, bilateral: Secondary | ICD-10-CM | POA: Diagnosis not present

## 2014-09-15 DIAGNOSIS — H6983 Other specified disorders of Eustachian tube, bilateral: Secondary | ICD-10-CM | POA: Diagnosis not present

## 2014-09-15 DIAGNOSIS — H7411 Adhesive right middle ear disease: Secondary | ICD-10-CM | POA: Diagnosis not present

## 2014-09-24 IMAGING — CR DG CHEST 2V
2 series · 2 of 2 positions shown · non-contrast
Comparison: Chest x-ray of 12/11/2012

CLINICAL DATA: Pleural effusion, followup, former smoking history

EXAM:
CHEST  2 VIEW

[w chest pa]
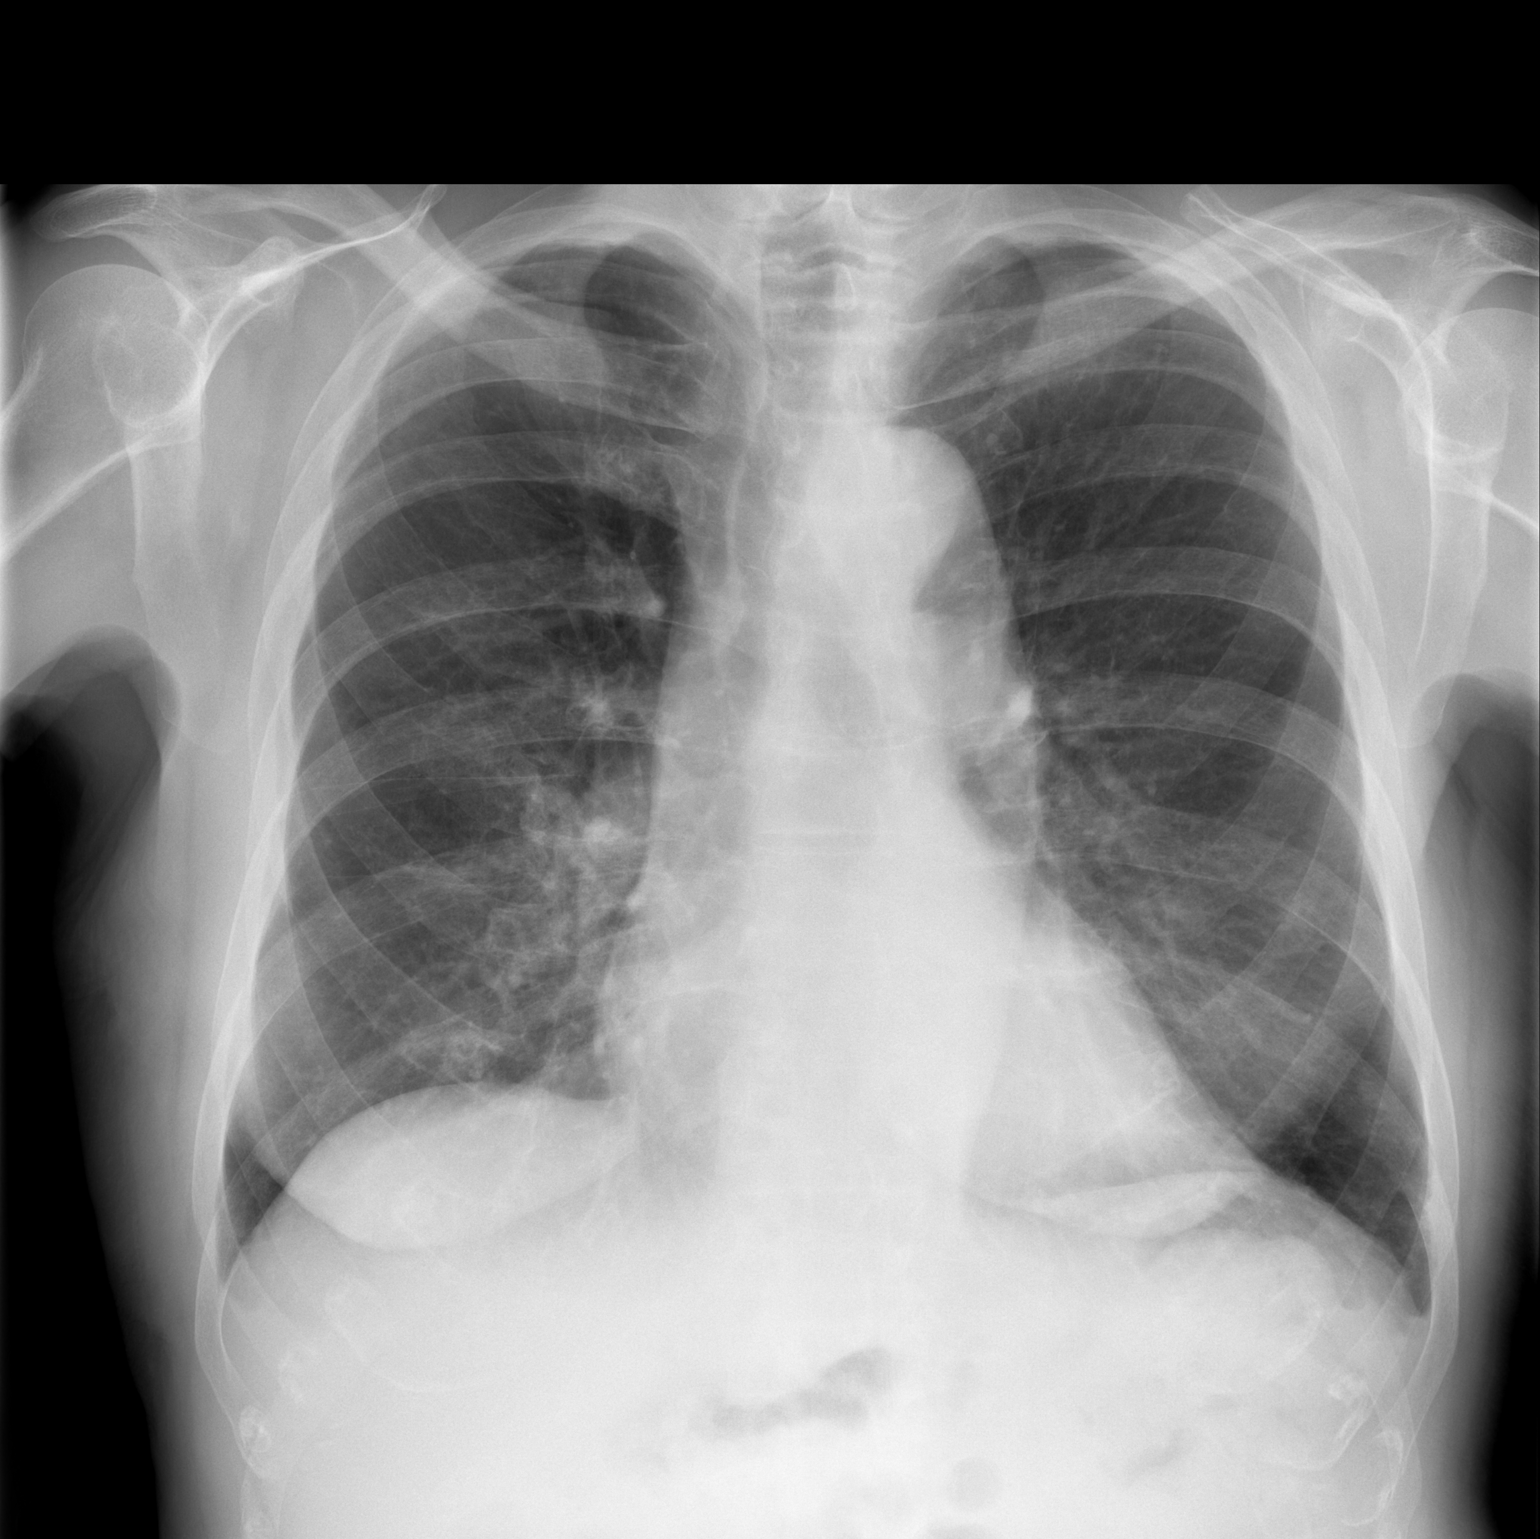

[w chest lat]
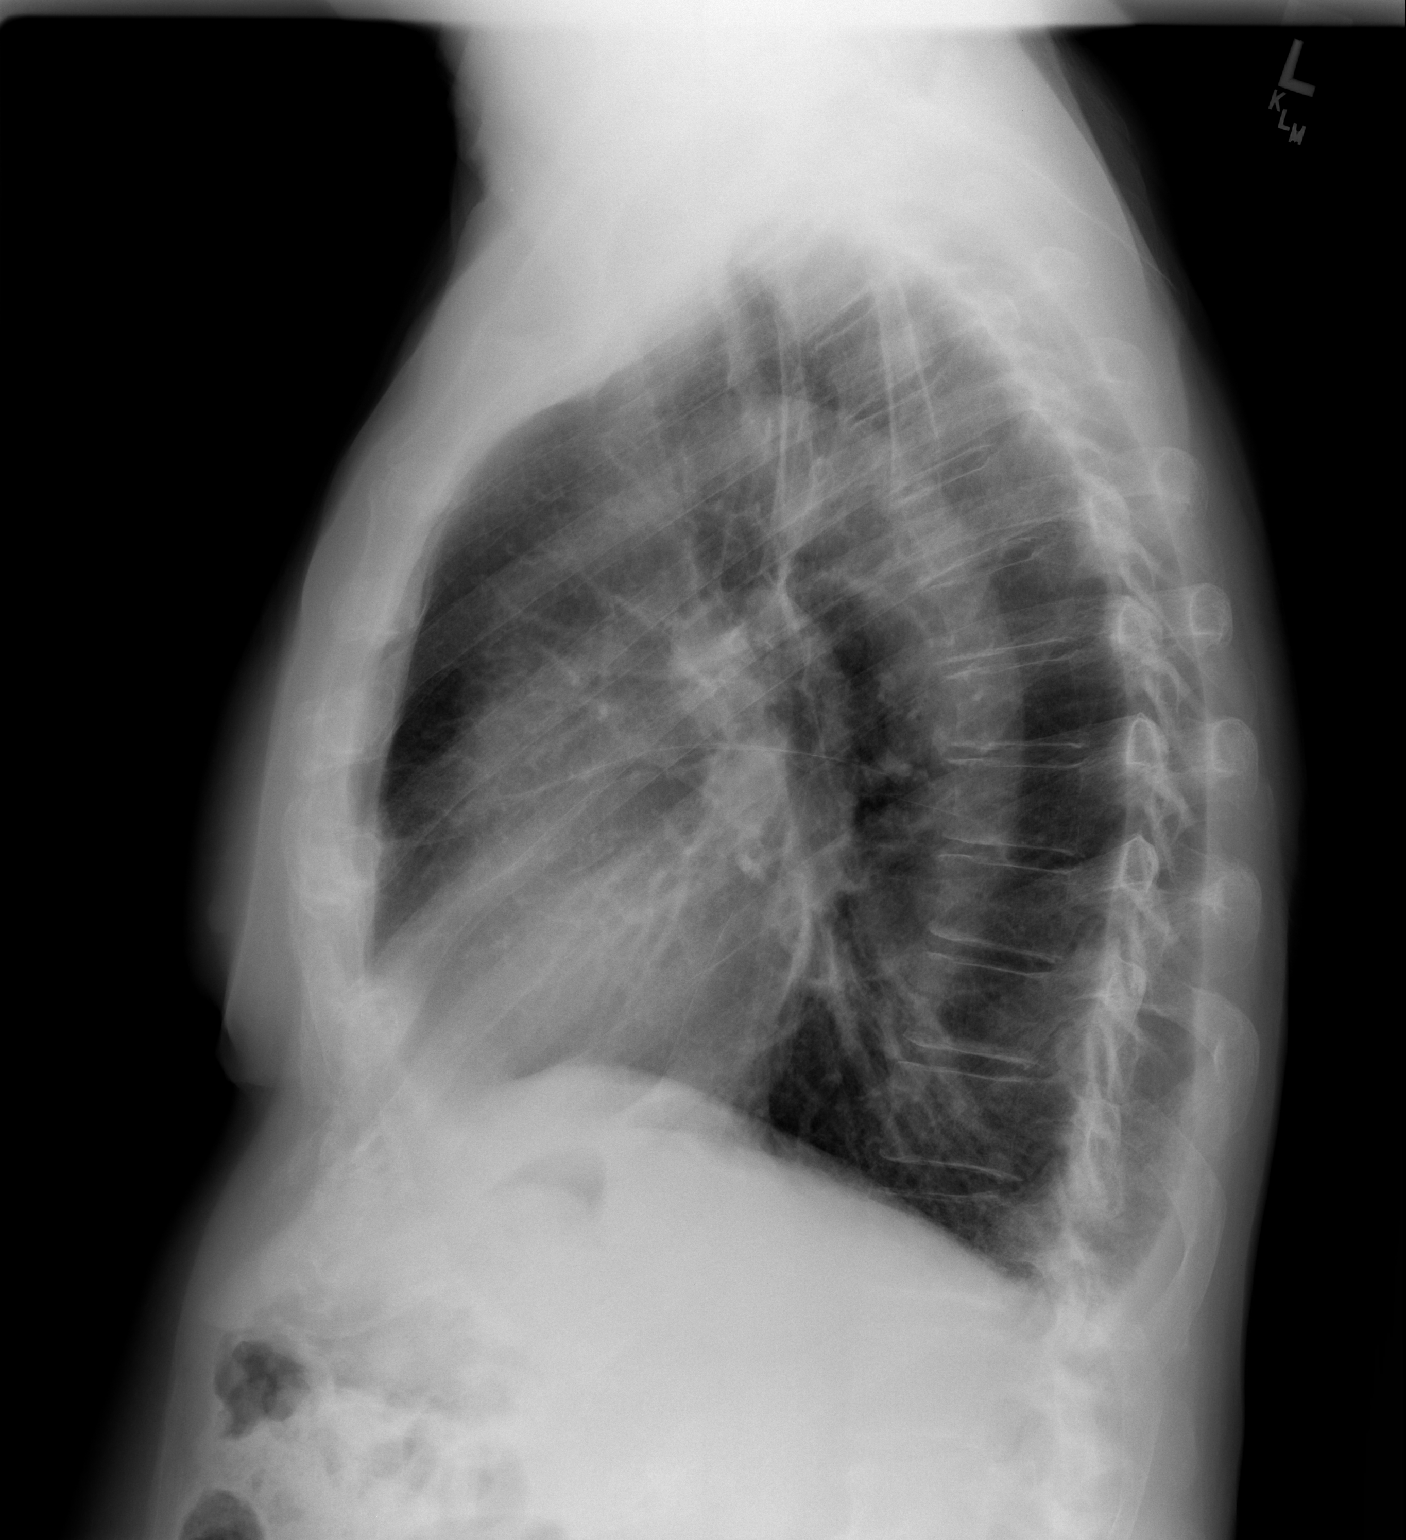

[2 of 2 positions shown; findings below may reference images not displayed]

FINDINGS: The pleural effusions appear to have resolved. The lungs appear
better aerated. Mediastinal contours are stable. The heart is mildly
enlarged and stable. The descending thoracic aorta is slightly
ectatic. No acute bony abnormality is seen.
IMPRESSION: No active lung disease. Resolution of small pleural effusions.

## 2014-09-29 ENCOUNTER — Ambulatory Visit: Payer: Medicare Other | Admitting: Cardiology

## 2014-10-07 DIAGNOSIS — L570 Actinic keratosis: Secondary | ICD-10-CM | POA: Diagnosis not present

## 2014-11-10 DIAGNOSIS — I10 Essential (primary) hypertension: Secondary | ICD-10-CM | POA: Diagnosis not present

## 2014-11-10 DIAGNOSIS — M545 Low back pain: Secondary | ICD-10-CM | POA: Diagnosis not present

## 2014-12-08 DIAGNOSIS — E559 Vitamin D deficiency, unspecified: Secondary | ICD-10-CM | POA: Diagnosis not present

## 2014-12-08 DIAGNOSIS — E782 Mixed hyperlipidemia: Secondary | ICD-10-CM | POA: Diagnosis not present

## 2014-12-08 DIAGNOSIS — Z79899 Other long term (current) drug therapy: Secondary | ICD-10-CM | POA: Diagnosis not present

## 2014-12-08 DIAGNOSIS — E790 Hyperuricemia without signs of inflammatory arthritis and tophaceous disease: Secondary | ICD-10-CM | POA: Diagnosis not present

## 2014-12-08 DIAGNOSIS — N400 Enlarged prostate without lower urinary tract symptoms: Secondary | ICD-10-CM | POA: Diagnosis not present

## 2014-12-08 DIAGNOSIS — I10 Essential (primary) hypertension: Secondary | ICD-10-CM | POA: Diagnosis not present

## 2014-12-09 DIAGNOSIS — Z23 Encounter for immunization: Secondary | ICD-10-CM | POA: Diagnosis not present

## 2014-12-09 DIAGNOSIS — R351 Nocturia: Secondary | ICD-10-CM | POA: Diagnosis not present

## 2014-12-09 DIAGNOSIS — Z1389 Encounter for screening for other disorder: Secondary | ICD-10-CM | POA: Diagnosis not present

## 2014-12-09 DIAGNOSIS — N400 Enlarged prostate without lower urinary tract symptoms: Secondary | ICD-10-CM | POA: Diagnosis not present

## 2014-12-09 DIAGNOSIS — E559 Vitamin D deficiency, unspecified: Secondary | ICD-10-CM | POA: Diagnosis not present

## 2014-12-09 DIAGNOSIS — N183 Chronic kidney disease, stage 3 (moderate): Secondary | ICD-10-CM | POA: Diagnosis not present

## 2014-12-09 DIAGNOSIS — E790 Hyperuricemia without signs of inflammatory arthritis and tophaceous disease: Secondary | ICD-10-CM | POA: Diagnosis not present

## 2014-12-09 DIAGNOSIS — E782 Mixed hyperlipidemia: Secondary | ICD-10-CM | POA: Diagnosis not present

## 2014-12-09 DIAGNOSIS — I4891 Unspecified atrial fibrillation: Secondary | ICD-10-CM | POA: Diagnosis not present

## 2014-12-09 DIAGNOSIS — Z0001 Encounter for general adult medical examination with abnormal findings: Secondary | ICD-10-CM | POA: Diagnosis not present

## 2014-12-09 DIAGNOSIS — Z79899 Other long term (current) drug therapy: Secondary | ICD-10-CM | POA: Diagnosis not present

## 2014-12-09 DIAGNOSIS — I10 Essential (primary) hypertension: Secondary | ICD-10-CM | POA: Diagnosis not present

## 2014-12-10 DIAGNOSIS — M545 Low back pain: Secondary | ICD-10-CM | POA: Diagnosis not present

## 2014-12-10 DIAGNOSIS — M5126 Other intervertebral disc displacement, lumbar region: Secondary | ICD-10-CM | POA: Diagnosis not present

## 2014-12-16 DIAGNOSIS — M4806 Spinal stenosis, lumbar region: Secondary | ICD-10-CM | POA: Diagnosis not present

## 2014-12-24 DIAGNOSIS — M4807 Spinal stenosis, lumbosacral region: Secondary | ICD-10-CM | POA: Diagnosis not present

## 2014-12-24 DIAGNOSIS — R262 Difficulty in walking, not elsewhere classified: Secondary | ICD-10-CM | POA: Diagnosis not present

## 2014-12-24 DIAGNOSIS — M6281 Muscle weakness (generalized): Secondary | ICD-10-CM | POA: Diagnosis not present

## 2014-12-26 DIAGNOSIS — M4807 Spinal stenosis, lumbosacral region: Secondary | ICD-10-CM | POA: Diagnosis not present

## 2014-12-26 DIAGNOSIS — R262 Difficulty in walking, not elsewhere classified: Secondary | ICD-10-CM | POA: Diagnosis not present

## 2014-12-26 DIAGNOSIS — M6281 Muscle weakness (generalized): Secondary | ICD-10-CM | POA: Diagnosis not present

## 2014-12-30 DIAGNOSIS — M4807 Spinal stenosis, lumbosacral region: Secondary | ICD-10-CM | POA: Diagnosis not present

## 2014-12-30 DIAGNOSIS — R262 Difficulty in walking, not elsewhere classified: Secondary | ICD-10-CM | POA: Diagnosis not present

## 2014-12-30 DIAGNOSIS — M6281 Muscle weakness (generalized): Secondary | ICD-10-CM | POA: Diagnosis not present

## 2015-01-01 DIAGNOSIS — R262 Difficulty in walking, not elsewhere classified: Secondary | ICD-10-CM | POA: Diagnosis not present

## 2015-01-01 DIAGNOSIS — M4807 Spinal stenosis, lumbosacral region: Secondary | ICD-10-CM | POA: Diagnosis not present

## 2015-01-01 DIAGNOSIS — M6281 Muscle weakness (generalized): Secondary | ICD-10-CM | POA: Diagnosis not present

## 2015-01-05 DIAGNOSIS — M4807 Spinal stenosis, lumbosacral region: Secondary | ICD-10-CM | POA: Diagnosis not present

## 2015-01-05 DIAGNOSIS — M6281 Muscle weakness (generalized): Secondary | ICD-10-CM | POA: Diagnosis not present

## 2015-01-05 DIAGNOSIS — R262 Difficulty in walking, not elsewhere classified: Secondary | ICD-10-CM | POA: Diagnosis not present

## 2015-01-09 DIAGNOSIS — M4807 Spinal stenosis, lumbosacral region: Secondary | ICD-10-CM | POA: Diagnosis not present

## 2015-01-09 DIAGNOSIS — R262 Difficulty in walking, not elsewhere classified: Secondary | ICD-10-CM | POA: Diagnosis not present

## 2015-01-09 DIAGNOSIS — M6281 Muscle weakness (generalized): Secondary | ICD-10-CM | POA: Diagnosis not present

## 2015-01-15 DIAGNOSIS — M4806 Spinal stenosis, lumbar region: Secondary | ICD-10-CM | POA: Diagnosis not present

## 2015-01-15 DIAGNOSIS — M5416 Radiculopathy, lumbar region: Secondary | ICD-10-CM | POA: Diagnosis not present

## 2015-01-19 DIAGNOSIS — M5416 Radiculopathy, lumbar region: Secondary | ICD-10-CM | POA: Diagnosis not present

## 2015-01-26 DIAGNOSIS — R262 Difficulty in walking, not elsewhere classified: Secondary | ICD-10-CM | POA: Diagnosis not present

## 2015-01-26 DIAGNOSIS — L905 Scar conditions and fibrosis of skin: Secondary | ICD-10-CM | POA: Diagnosis not present

## 2015-01-26 DIAGNOSIS — M6281 Muscle weakness (generalized): Secondary | ICD-10-CM | POA: Diagnosis not present

## 2015-01-26 DIAGNOSIS — B078 Other viral warts: Secondary | ICD-10-CM | POA: Diagnosis not present

## 2015-01-26 DIAGNOSIS — M4807 Spinal stenosis, lumbosacral region: Secondary | ICD-10-CM | POA: Diagnosis not present

## 2015-01-29 DIAGNOSIS — R262 Difficulty in walking, not elsewhere classified: Secondary | ICD-10-CM | POA: Diagnosis not present

## 2015-01-29 DIAGNOSIS — M4807 Spinal stenosis, lumbosacral region: Secondary | ICD-10-CM | POA: Diagnosis not present

## 2015-01-29 DIAGNOSIS — M6281 Muscle weakness (generalized): Secondary | ICD-10-CM | POA: Diagnosis not present

## 2015-02-03 DIAGNOSIS — R262 Difficulty in walking, not elsewhere classified: Secondary | ICD-10-CM | POA: Diagnosis not present

## 2015-02-03 DIAGNOSIS — M6281 Muscle weakness (generalized): Secondary | ICD-10-CM | POA: Diagnosis not present

## 2015-02-03 DIAGNOSIS — M4807 Spinal stenosis, lumbosacral region: Secondary | ICD-10-CM | POA: Diagnosis not present

## 2015-02-05 DIAGNOSIS — M6281 Muscle weakness (generalized): Secondary | ICD-10-CM | POA: Diagnosis not present

## 2015-02-05 DIAGNOSIS — M4807 Spinal stenosis, lumbosacral region: Secondary | ICD-10-CM | POA: Diagnosis not present

## 2015-02-05 DIAGNOSIS — R262 Difficulty in walking, not elsewhere classified: Secondary | ICD-10-CM | POA: Diagnosis not present

## 2015-02-09 DIAGNOSIS — M4807 Spinal stenosis, lumbosacral region: Secondary | ICD-10-CM | POA: Diagnosis not present

## 2015-02-09 DIAGNOSIS — R262 Difficulty in walking, not elsewhere classified: Secondary | ICD-10-CM | POA: Diagnosis not present

## 2015-02-09 DIAGNOSIS — M6281 Muscle weakness (generalized): Secondary | ICD-10-CM | POA: Diagnosis not present

## 2015-02-18 DIAGNOSIS — M6281 Muscle weakness (generalized): Secondary | ICD-10-CM | POA: Diagnosis not present

## 2015-02-18 DIAGNOSIS — R262 Difficulty in walking, not elsewhere classified: Secondary | ICD-10-CM | POA: Diagnosis not present

## 2015-02-18 DIAGNOSIS — M4807 Spinal stenosis, lumbosacral region: Secondary | ICD-10-CM | POA: Diagnosis not present

## 2015-02-24 DIAGNOSIS — H906 Mixed conductive and sensorineural hearing loss, bilateral: Secondary | ICD-10-CM | POA: Diagnosis not present

## 2015-02-24 DIAGNOSIS — H7411 Adhesive right middle ear disease: Secondary | ICD-10-CM | POA: Diagnosis not present

## 2015-02-24 DIAGNOSIS — H6983 Other specified disorders of Eustachian tube, bilateral: Secondary | ICD-10-CM | POA: Diagnosis not present

## 2015-03-02 DIAGNOSIS — M6281 Muscle weakness (generalized): Secondary | ICD-10-CM | POA: Diagnosis not present

## 2015-03-02 DIAGNOSIS — M4807 Spinal stenosis, lumbosacral region: Secondary | ICD-10-CM | POA: Diagnosis not present

## 2015-03-02 DIAGNOSIS — R262 Difficulty in walking, not elsewhere classified: Secondary | ICD-10-CM | POA: Diagnosis not present

## 2015-03-05 DIAGNOSIS — M4807 Spinal stenosis, lumbosacral region: Secondary | ICD-10-CM | POA: Diagnosis not present

## 2015-03-05 DIAGNOSIS — R262 Difficulty in walking, not elsewhere classified: Secondary | ICD-10-CM | POA: Diagnosis not present

## 2015-03-05 DIAGNOSIS — M6281 Muscle weakness (generalized): Secondary | ICD-10-CM | POA: Diagnosis not present

## 2015-03-09 DIAGNOSIS — I4891 Unspecified atrial fibrillation: Secondary | ICD-10-CM | POA: Diagnosis not present

## 2015-03-09 DIAGNOSIS — R609 Edema, unspecified: Secondary | ICD-10-CM | POA: Diagnosis not present

## 2015-03-09 DIAGNOSIS — E785 Hyperlipidemia, unspecified: Secondary | ICD-10-CM | POA: Diagnosis not present

## 2015-03-10 DIAGNOSIS — M6281 Muscle weakness (generalized): Secondary | ICD-10-CM | POA: Diagnosis not present

## 2015-03-10 DIAGNOSIS — M4807 Spinal stenosis, lumbosacral region: Secondary | ICD-10-CM | POA: Diagnosis not present

## 2015-03-10 DIAGNOSIS — R262 Difficulty in walking, not elsewhere classified: Secondary | ICD-10-CM | POA: Diagnosis not present

## 2015-03-17 DIAGNOSIS — R262 Difficulty in walking, not elsewhere classified: Secondary | ICD-10-CM | POA: Diagnosis not present

## 2015-03-17 DIAGNOSIS — M4807 Spinal stenosis, lumbosacral region: Secondary | ICD-10-CM | POA: Diagnosis not present

## 2015-03-17 DIAGNOSIS — M6281 Muscle weakness (generalized): Secondary | ICD-10-CM | POA: Diagnosis not present

## 2015-03-20 DIAGNOSIS — M6281 Muscle weakness (generalized): Secondary | ICD-10-CM | POA: Diagnosis not present

## 2015-03-20 DIAGNOSIS — M4807 Spinal stenosis, lumbosacral region: Secondary | ICD-10-CM | POA: Diagnosis not present

## 2015-03-20 DIAGNOSIS — R262 Difficulty in walking, not elsewhere classified: Secondary | ICD-10-CM | POA: Diagnosis not present

## 2015-03-24 DIAGNOSIS — R262 Difficulty in walking, not elsewhere classified: Secondary | ICD-10-CM | POA: Diagnosis not present

## 2015-03-24 DIAGNOSIS — M4807 Spinal stenosis, lumbosacral region: Secondary | ICD-10-CM | POA: Diagnosis not present

## 2015-03-24 DIAGNOSIS — M6281 Muscle weakness (generalized): Secondary | ICD-10-CM | POA: Diagnosis not present

## 2015-03-30 ENCOUNTER — Other Ambulatory Visit: Payer: Self-pay | Admitting: Cardiology

## 2015-03-30 DIAGNOSIS — M6281 Muscle weakness (generalized): Secondary | ICD-10-CM | POA: Diagnosis not present

## 2015-03-30 DIAGNOSIS — M4807 Spinal stenosis, lumbosacral region: Secondary | ICD-10-CM | POA: Diagnosis not present

## 2015-03-30 DIAGNOSIS — R262 Difficulty in walking, not elsewhere classified: Secondary | ICD-10-CM | POA: Diagnosis not present

## 2015-04-10 DIAGNOSIS — H43393 Other vitreous opacities, bilateral: Secondary | ICD-10-CM | POA: Diagnosis not present

## 2015-04-10 DIAGNOSIS — H35363 Drusen (degenerative) of macula, bilateral: Secondary | ICD-10-CM | POA: Diagnosis not present

## 2015-05-13 DIAGNOSIS — R3916 Straining to void: Secondary | ICD-10-CM | POA: Diagnosis not present

## 2015-05-13 DIAGNOSIS — Z0000 Encounter for general adult medical examination without abnormal findings: Secondary | ICD-10-CM | POA: Diagnosis not present

## 2015-05-13 DIAGNOSIS — N401 Enlarged prostate with lower urinary tract symptoms: Secondary | ICD-10-CM | POA: Diagnosis not present

## 2015-05-14 DIAGNOSIS — I10 Essential (primary) hypertension: Secondary | ICD-10-CM | POA: Diagnosis not present

## 2015-05-14 DIAGNOSIS — N183 Chronic kidney disease, stage 3 (moderate): Secondary | ICD-10-CM | POA: Diagnosis not present

## 2015-05-18 DIAGNOSIS — I2510 Atherosclerotic heart disease of native coronary artery without angina pectoris: Secondary | ICD-10-CM | POA: Diagnosis not present

## 2015-05-18 DIAGNOSIS — N183 Chronic kidney disease, stage 3 (moderate): Secondary | ICD-10-CM | POA: Diagnosis not present

## 2015-05-18 DIAGNOSIS — N400 Enlarged prostate without lower urinary tract symptoms: Secondary | ICD-10-CM | POA: Diagnosis not present

## 2015-05-18 DIAGNOSIS — E559 Vitamin D deficiency, unspecified: Secondary | ICD-10-CM | POA: Diagnosis not present

## 2015-05-18 DIAGNOSIS — I4891 Unspecified atrial fibrillation: Secondary | ICD-10-CM | POA: Diagnosis not present

## 2015-05-18 DIAGNOSIS — E782 Mixed hyperlipidemia: Secondary | ICD-10-CM | POA: Diagnosis not present

## 2015-05-18 DIAGNOSIS — E790 Hyperuricemia without signs of inflammatory arthritis and tophaceous disease: Secondary | ICD-10-CM | POA: Diagnosis not present

## 2015-05-21 DIAGNOSIS — Z471 Aftercare following joint replacement surgery: Secondary | ICD-10-CM | POA: Diagnosis not present

## 2015-05-21 DIAGNOSIS — Z96641 Presence of right artificial hip joint: Secondary | ICD-10-CM | POA: Diagnosis not present

## 2015-05-21 DIAGNOSIS — Z96642 Presence of left artificial hip joint: Secondary | ICD-10-CM | POA: Diagnosis not present

## 2015-07-08 DIAGNOSIS — L309 Dermatitis, unspecified: Secondary | ICD-10-CM | POA: Diagnosis not present

## 2015-07-08 DIAGNOSIS — L570 Actinic keratosis: Secondary | ICD-10-CM | POA: Diagnosis not present

## 2015-07-16 ENCOUNTER — Other Ambulatory Visit: Payer: Self-pay | Admitting: Cardiology

## 2015-08-24 DIAGNOSIS — R609 Edema, unspecified: Secondary | ICD-10-CM | POA: Diagnosis not present

## 2015-08-24 DIAGNOSIS — R0600 Dyspnea, unspecified: Secondary | ICD-10-CM | POA: Diagnosis not present

## 2015-08-24 DIAGNOSIS — I4891 Unspecified atrial fibrillation: Secondary | ICD-10-CM | POA: Diagnosis not present

## 2015-08-28 DIAGNOSIS — Z79899 Other long term (current) drug therapy: Secondary | ICD-10-CM | POA: Diagnosis not present

## 2015-08-28 DIAGNOSIS — N50811 Right testicular pain: Secondary | ICD-10-CM | POA: Diagnosis not present

## 2015-08-28 DIAGNOSIS — I4891 Unspecified atrial fibrillation: Secondary | ICD-10-CM | POA: Diagnosis not present

## 2015-08-28 DIAGNOSIS — M545 Low back pain: Secondary | ICD-10-CM | POA: Diagnosis not present

## 2015-08-28 DIAGNOSIS — R5383 Other fatigue: Secondary | ICD-10-CM | POA: Diagnosis not present

## 2015-08-31 DIAGNOSIS — R0600 Dyspnea, unspecified: Secondary | ICD-10-CM | POA: Diagnosis not present

## 2015-09-08 DIAGNOSIS — N433 Hydrocele, unspecified: Secondary | ICD-10-CM | POA: Diagnosis not present

## 2015-09-11 DIAGNOSIS — I10 Essential (primary) hypertension: Secondary | ICD-10-CM | POA: Diagnosis not present

## 2015-09-11 DIAGNOSIS — M545 Low back pain: Secondary | ICD-10-CM | POA: Diagnosis not present

## 2015-09-11 DIAGNOSIS — I2510 Atherosclerotic heart disease of native coronary artery without angina pectoris: Secondary | ICD-10-CM | POA: Diagnosis not present

## 2015-09-11 DIAGNOSIS — R5383 Other fatigue: Secondary | ICD-10-CM | POA: Diagnosis not present

## 2015-10-01 DIAGNOSIS — M6281 Muscle weakness (generalized): Secondary | ICD-10-CM | POA: Diagnosis not present

## 2015-10-01 DIAGNOSIS — M545 Low back pain: Secondary | ICD-10-CM | POA: Diagnosis not present

## 2015-10-01 DIAGNOSIS — M5116 Intervertebral disc disorders with radiculopathy, lumbar region: Secondary | ICD-10-CM | POA: Diagnosis not present

## 2015-10-06 DIAGNOSIS — M5116 Intervertebral disc disorders with radiculopathy, lumbar region: Secondary | ICD-10-CM | POA: Diagnosis not present

## 2015-10-06 DIAGNOSIS — M6281 Muscle weakness (generalized): Secondary | ICD-10-CM | POA: Diagnosis not present

## 2015-10-06 DIAGNOSIS — M545 Low back pain: Secondary | ICD-10-CM | POA: Diagnosis not present

## 2015-10-08 DIAGNOSIS — M6281 Muscle weakness (generalized): Secondary | ICD-10-CM | POA: Diagnosis not present

## 2015-10-08 DIAGNOSIS — M5116 Intervertebral disc disorders with radiculopathy, lumbar region: Secondary | ICD-10-CM | POA: Diagnosis not present

## 2015-10-08 DIAGNOSIS — M545 Low back pain: Secondary | ICD-10-CM | POA: Diagnosis not present

## 2015-10-13 DIAGNOSIS — L570 Actinic keratosis: Secondary | ICD-10-CM | POA: Diagnosis not present

## 2015-10-22 DIAGNOSIS — M6281 Muscle weakness (generalized): Secondary | ICD-10-CM | POA: Diagnosis not present

## 2015-10-22 DIAGNOSIS — M5116 Intervertebral disc disorders with radiculopathy, lumbar region: Secondary | ICD-10-CM | POA: Diagnosis not present

## 2015-10-22 DIAGNOSIS — M545 Low back pain: Secondary | ICD-10-CM | POA: Diagnosis not present

## 2015-10-27 DIAGNOSIS — M6281 Muscle weakness (generalized): Secondary | ICD-10-CM | POA: Diagnosis not present

## 2015-10-27 DIAGNOSIS — M5116 Intervertebral disc disorders with radiculopathy, lumbar region: Secondary | ICD-10-CM | POA: Diagnosis not present

## 2015-10-27 DIAGNOSIS — M545 Low back pain: Secondary | ICD-10-CM | POA: Diagnosis not present

## 2015-10-29 DIAGNOSIS — M5116 Intervertebral disc disorders with radiculopathy, lumbar region: Secondary | ICD-10-CM | POA: Diagnosis not present

## 2015-10-29 DIAGNOSIS — M545 Low back pain: Secondary | ICD-10-CM | POA: Diagnosis not present

## 2015-10-29 DIAGNOSIS — M6281 Muscle weakness (generalized): Secondary | ICD-10-CM | POA: Diagnosis not present

## 2015-11-03 DIAGNOSIS — M6281 Muscle weakness (generalized): Secondary | ICD-10-CM | POA: Diagnosis not present

## 2015-11-03 DIAGNOSIS — M5116 Intervertebral disc disorders with radiculopathy, lumbar region: Secondary | ICD-10-CM | POA: Diagnosis not present

## 2015-11-03 DIAGNOSIS — M545 Low back pain: Secondary | ICD-10-CM | POA: Diagnosis not present

## 2015-11-10 DIAGNOSIS — M545 Low back pain: Secondary | ICD-10-CM | POA: Diagnosis not present

## 2015-11-10 DIAGNOSIS — M6281 Muscle weakness (generalized): Secondary | ICD-10-CM | POA: Diagnosis not present

## 2015-11-10 DIAGNOSIS — M5116 Intervertebral disc disorders with radiculopathy, lumbar region: Secondary | ICD-10-CM | POA: Diagnosis not present

## 2015-11-19 ENCOUNTER — Other Ambulatory Visit: Payer: Self-pay | Admitting: Cardiology

## 2015-12-01 DIAGNOSIS — M6281 Muscle weakness (generalized): Secondary | ICD-10-CM | POA: Diagnosis not present

## 2015-12-01 DIAGNOSIS — M545 Low back pain: Secondary | ICD-10-CM | POA: Diagnosis not present

## 2015-12-01 DIAGNOSIS — M5116 Intervertebral disc disorders with radiculopathy, lumbar region: Secondary | ICD-10-CM | POA: Diagnosis not present

## 2015-12-08 DIAGNOSIS — M6281 Muscle weakness (generalized): Secondary | ICD-10-CM | POA: Diagnosis not present

## 2015-12-08 DIAGNOSIS — M545 Low back pain: Secondary | ICD-10-CM | POA: Diagnosis not present

## 2015-12-08 DIAGNOSIS — M5116 Intervertebral disc disorders with radiculopathy, lumbar region: Secondary | ICD-10-CM | POA: Diagnosis not present

## 2015-12-14 DIAGNOSIS — M545 Low back pain: Secondary | ICD-10-CM | POA: Diagnosis not present

## 2015-12-14 DIAGNOSIS — M6281 Muscle weakness (generalized): Secondary | ICD-10-CM | POA: Diagnosis not present

## 2015-12-14 DIAGNOSIS — M5116 Intervertebral disc disorders with radiculopathy, lumbar region: Secondary | ICD-10-CM | POA: Diagnosis not present

## 2015-12-18 DIAGNOSIS — R319 Hematuria, unspecified: Secondary | ICD-10-CM | POA: Diagnosis not present

## 2015-12-18 DIAGNOSIS — I482 Chronic atrial fibrillation: Secondary | ICD-10-CM | POA: Diagnosis not present

## 2015-12-18 DIAGNOSIS — E559 Vitamin D deficiency, unspecified: Secondary | ICD-10-CM | POA: Diagnosis not present

## 2015-12-18 DIAGNOSIS — N183 Chronic kidney disease, stage 3 (moderate): Secondary | ICD-10-CM | POA: Diagnosis not present

## 2015-12-18 DIAGNOSIS — E782 Mixed hyperlipidemia: Secondary | ICD-10-CM | POA: Diagnosis not present

## 2015-12-18 DIAGNOSIS — Z79899 Other long term (current) drug therapy: Secondary | ICD-10-CM | POA: Diagnosis not present

## 2015-12-22 DIAGNOSIS — E782 Mixed hyperlipidemia: Secondary | ICD-10-CM | POA: Diagnosis not present

## 2015-12-22 DIAGNOSIS — Z0001 Encounter for general adult medical examination with abnormal findings: Secondary | ICD-10-CM | POA: Diagnosis not present

## 2015-12-22 DIAGNOSIS — N183 Chronic kidney disease, stage 3 (moderate): Secondary | ICD-10-CM | POA: Diagnosis not present

## 2015-12-22 DIAGNOSIS — Z1389 Encounter for screening for other disorder: Secondary | ICD-10-CM | POA: Diagnosis not present

## 2015-12-22 DIAGNOSIS — H6121 Impacted cerumen, right ear: Secondary | ICD-10-CM | POA: Diagnosis not present

## 2015-12-22 DIAGNOSIS — E559 Vitamin D deficiency, unspecified: Secondary | ICD-10-CM | POA: Diagnosis not present

## 2015-12-22 DIAGNOSIS — E790 Hyperuricemia without signs of inflammatory arthritis and tophaceous disease: Secondary | ICD-10-CM | POA: Diagnosis not present

## 2015-12-22 DIAGNOSIS — M4800 Spinal stenosis, site unspecified: Secondary | ICD-10-CM | POA: Diagnosis not present

## 2015-12-22 DIAGNOSIS — I4891 Unspecified atrial fibrillation: Secondary | ICD-10-CM | POA: Diagnosis not present

## 2015-12-22 DIAGNOSIS — I10 Essential (primary) hypertension: Secondary | ICD-10-CM | POA: Diagnosis not present

## 2015-12-22 DIAGNOSIS — I2510 Atherosclerotic heart disease of native coronary artery without angina pectoris: Secondary | ICD-10-CM | POA: Diagnosis not present

## 2015-12-22 DIAGNOSIS — N400 Enlarged prostate without lower urinary tract symptoms: Secondary | ICD-10-CM | POA: Diagnosis not present

## 2015-12-29 DIAGNOSIS — M5116 Intervertebral disc disorders with radiculopathy, lumbar region: Secondary | ICD-10-CM | POA: Diagnosis not present

## 2015-12-29 DIAGNOSIS — M545 Low back pain: Secondary | ICD-10-CM | POA: Diagnosis not present

## 2015-12-29 DIAGNOSIS — M6281 Muscle weakness (generalized): Secondary | ICD-10-CM | POA: Diagnosis not present

## 2016-01-05 DIAGNOSIS — M545 Low back pain: Secondary | ICD-10-CM | POA: Diagnosis not present

## 2016-01-05 DIAGNOSIS — M5116 Intervertebral disc disorders with radiculopathy, lumbar region: Secondary | ICD-10-CM | POA: Diagnosis not present

## 2016-01-05 DIAGNOSIS — M6281 Muscle weakness (generalized): Secondary | ICD-10-CM | POA: Diagnosis not present

## 2016-01-19 DIAGNOSIS — M545 Low back pain: Secondary | ICD-10-CM | POA: Diagnosis not present

## 2016-01-19 DIAGNOSIS — M5116 Intervertebral disc disorders with radiculopathy, lumbar region: Secondary | ICD-10-CM | POA: Diagnosis not present

## 2016-01-19 DIAGNOSIS — M6281 Muscle weakness (generalized): Secondary | ICD-10-CM | POA: Diagnosis not present

## 2016-01-25 ENCOUNTER — Other Ambulatory Visit: Payer: Self-pay | Admitting: Cardiology

## 2016-01-27 ENCOUNTER — Ambulatory Visit: Payer: Medicare Other | Admitting: Cardiology

## 2016-02-16 ENCOUNTER — Other Ambulatory Visit: Payer: Self-pay | Admitting: Cardiology

## 2016-02-16 ENCOUNTER — Encounter: Payer: Self-pay | Admitting: Cardiology

## 2016-02-17 DIAGNOSIS — L570 Actinic keratosis: Secondary | ICD-10-CM | POA: Diagnosis not present

## 2016-02-22 DIAGNOSIS — M5416 Radiculopathy, lumbar region: Secondary | ICD-10-CM | POA: Diagnosis not present

## 2016-02-24 ENCOUNTER — Encounter: Payer: Self-pay | Admitting: Cardiology

## 2016-02-24 ENCOUNTER — Ambulatory Visit (INDEPENDENT_AMBULATORY_CARE_PROVIDER_SITE_OTHER): Payer: Medicare Other | Admitting: Cardiology

## 2016-02-24 ENCOUNTER — Other Ambulatory Visit: Payer: Self-pay | Admitting: Cardiology

## 2016-02-24 VITALS — BP 146/72 | HR 68 | Ht 69.0 in | Wt 172.0 lb

## 2016-02-24 DIAGNOSIS — I4510 Unspecified right bundle-branch block: Secondary | ICD-10-CM

## 2016-02-24 DIAGNOSIS — I2583 Coronary atherosclerosis due to lipid rich plaque: Secondary | ICD-10-CM | POA: Diagnosis not present

## 2016-02-24 DIAGNOSIS — Z7901 Long term (current) use of anticoagulants: Secondary | ICD-10-CM | POA: Diagnosis not present

## 2016-02-24 DIAGNOSIS — I4819 Other persistent atrial fibrillation: Secondary | ICD-10-CM

## 2016-02-24 DIAGNOSIS — I481 Persistent atrial fibrillation: Secondary | ICD-10-CM

## 2016-02-24 DIAGNOSIS — I2510 Atherosclerotic heart disease of native coronary artery without angina pectoris: Secondary | ICD-10-CM | POA: Diagnosis not present

## 2016-02-24 MED ORDER — RIVAROXABAN 15 MG PO TABS: 15.0000 mg | ORAL_TABLET | Freq: Every day | ORAL | 3 refills | Status: DC

## 2016-02-24 NOTE — Progress Notes (Signed)
[facility]. [address] [place], [place]  [address] Phone: [phone] Fax:  [phone]  Date:  02/24/2016    ID:  [name], DOB Apr 27, 1926, MRN [id number]  PCP:  Henrine Screws, MD   History of Present Illness: [name] is a 82 y.o. male atrial fibrillation in the setting of pericardial effusion, subacute rib fractures, Dr. Inda Merlin is PCP, pacemaker. Treated with prednisone therapy. Has an internist as well as in [place]. On chronic anticoagulation. Echocardiogram showed resolution of pericardial effusion.  He has been doing well on anticoagulation, Xarelto 15 mg. No bleeding, no syncope, no falls. Not having any chest discomfort. Periodically when he swims he may feel some right shoulder discomfort which is musculoskeletal.  In regards to atrial fibrillation, he is asymptomatic. Remains in atrial fibrillation.   Prior ESR normal at Dr. Lenna Gilford.  Has appt in [place] on 57 and 1-2nd. Travels back and forth.      Wt Readings from Last 3 Encounters:  02/24/16 172 lb (78 kg)  08/25/14 170 lb (77.1 kg)  01/21/14 173 lb 12.8 oz (78.8 kg)     Past Medical History:  Diagnosis Date  . Allergic rhinitis   . Arthritis   . Atrial fibrillation ([place])   . Coronary artery disease    mild, non-obstructive CAD by 2001 cath  . Hearing decreased   . Hypertension   . Hyponatremia   . Impacted cerumen   . Myalgia   . Pericardial effusion    August 2014-repeat echocardiogram 12/11/12-no effusion  . Pleural effusion    August 2014  . Pure hypercholesterolemia   . PVC (premature ventricular contraction)   . RBBB    with left anterior hemiblock  . Renal insufficiency    stage III, creatinine 1.41, June, 2013 - Celebrex reduced  . Spinal stenosis    with back pain - considering neurosurgery with Dr. Sherley Bounds, March, 2014  . Tachycardia   . Vitamin D deficiency     Past Surgical History:  Procedure Laterality Date  . CARDIAC CATHETERIZATION      normal left main, LAD with 40-50% narrowing proximal LAD, diagonal vessel with 20-30% irregular lesion, left circumflex normal, right coronary artery normal, left ventricular function normal - Dr. Bea Laura  . CATARACT EXTRACTION    . HERNIA REPAIR    . INNER EAR SURGERY    . JOINT REPLACEMENT Bilateral    hip  . LUMBAR LAMINECTOMY/DECOMPRESSION MICRODISCECTOMY N/A 08/08/2012   Procedure: LUMBAR FOUR TO FIVE, LUMBAR FIVE TO SACRAL ONE LUMBAR DECOMPRESSION;  Surgeon: Eustace Moore, MD;  Location: [facility];  Service: Neurosurgery;  Laterality: N/A;  LUMBAR LAMINECTOMY/DECOMPRESSION MICRODISCECTOMY 2 LEVELS    Current Outpatient Prescriptions  Medication Sig Dispense Refill  . ALPRAZolam (XANAX) 0.25 MG tablet Take 0.25 mg by mouth at bedtime as needed for anxiety.    Marland Kitchen amLODipine (NORVASC) 5 MG tablet Take 5 mg by mouth daily.    . cholecalciferol (VITAMIN D) 1000 UNITS tablet Take 1,000 Units by mouth daily.    . finasteride (PROSCAR) 5 MG tablet Take 5 mg by mouth at bedtime.    . metoprolol succinate (TOPROL-XL) 50 MG 24 hr tablet Take 50 mg by mouth daily. Take with or immediately following a meal.    . Multiple Vitamins-Minerals (MULTIVITAMIN WITH MINERALS) tablet Take 1 tablet by mouth daily.    . probenecid (BENEMID) 500 MG tablet Take 250 mg by mouth daily.     Marland Kitchen  Rivaroxaban (XARELTO) 15 MG TABS tablet Take 1 tablet (15 mg total) by mouth daily. 90 tablet 3  . rosuvastatin (CRESTOR) 5 MG tablet Take 5 mg by mouth at bedtime.    . terazosin (HYTRIN) 10 MG capsule Take 10 mg by mouth at bedtime.    . TRAMADOL HCL ER PO Take by mouth as needed.    . valsartan-hydrochlorothiazide (DIOVAN-HCT) 160-25 MG per tablet Take 0.5 tablets by mouth daily.    . zolpidem (AMBIEN) 5 MG tablet Take 5 mg by mouth at bedtime as needed for sleep.     No current facility-administered medications for this visit.     Allergies:   No Known Allergies  Social History:  The patient  reports that he  has never smoked. He has never used smokeless tobacco. He reports that he drinks about 0.5 oz of alcohol per week . He reports that he does not use drugs.   ROS:  Please see the history of present illness.   No syncope, no bleeding, no orthopnea, no PND    PHYSICAL EXAM: VS:  BP (!) 146/72   Pulse 68   Ht 5' 9" (1.753 m)   Wt 172 lb (78 kg)   BMI 25.40 kg/m  Well nourished, well developed, in no acute distress  HEENT: normal  Neck: no JVD  Cardiac: Irregularly irregular, no appreciable rubs, no murmur  Lungs:  clear to auscultation bilaterally, no wheezing, rhonchi or rales  Abd: soft, nontender, no hepatomegaly  Ext: no edema  Skin: warm and dry  Neuro: no focal abnormalities noted  EKG: 01/21/14-atrial fibrillation heart rate 71, right bundle branch block. Previously atrial fibrillation    Echocardiogram 08/31/15 in [place]: LV normal, mild left atrial enlargement, mild thickening of mitral valve, moderate tricuspid regurgitation, tricuspid regurgitation velocity 3.0 m/s, 44 mmHg estimated.  Stress echocardiogram 08/31/15: No evidence of ischemia.  ASSESSMENT AND PLAN:  1. Atrial fibrillation-we will continue with metoprolol as well as Xarelto 15 mg. Lower dose was being utilized because of creatinine clearance.  asymptomatic. Continue with rate control. No changes made. Echocardiogram performed in [place] showed EF of 55-60%. Right ventricle was normal. Moderate left atrial enlargement. Report has been scanned. 2. Chronic anticoagulation-as above. Monitoring. Checking blood work every 6 months. Dr. Gates. 3. HTN - BP normal today, but normal otherwise.  4. RBBB - stable, no syncope. 5. Inflammatory illness, pleural/pericardial effusion-resolved. ESR now normal. 6. Coronary artery disease-nonobstructive, mild-2001 catheterization. Exercise treadmill test showed increased exercise capacity. No ischemic changes. 7. Rib fractures-resolved. Rare shoulder pain when swimming.    8. We will see him back in 6 months.  Signed, Mark Skains, MD FACC  02/24/2016 12:08 PM    

## 2016-02-24 NOTE — Patient Instructions (Signed)

## 2016-03-17 DIAGNOSIS — L570 Actinic keratosis: Secondary | ICD-10-CM | POA: Diagnosis not present

## 2016-04-14 DIAGNOSIS — I4891 Unspecified atrial fibrillation: Secondary | ICD-10-CM | POA: Diagnosis not present

## 2016-04-14 DIAGNOSIS — R0600 Dyspnea, unspecified: Secondary | ICD-10-CM | POA: Diagnosis not present

## 2016-04-14 DIAGNOSIS — R609 Edema, unspecified: Secondary | ICD-10-CM | POA: Diagnosis not present

## 2016-05-02 DIAGNOSIS — Z961 Presence of intraocular lens: Secondary | ICD-10-CM | POA: Diagnosis not present

## 2016-05-02 DIAGNOSIS — H43393 Other vitreous opacities, bilateral: Secondary | ICD-10-CM | POA: Diagnosis not present

## 2016-05-02 DIAGNOSIS — H35363 Drusen (degenerative) of macula, bilateral: Secondary | ICD-10-CM | POA: Diagnosis not present

## 2016-05-11 DIAGNOSIS — Z471 Aftercare following joint replacement surgery: Secondary | ICD-10-CM | POA: Diagnosis not present

## 2016-05-11 DIAGNOSIS — Z96641 Presence of right artificial hip joint: Secondary | ICD-10-CM | POA: Diagnosis not present

## 2016-05-11 DIAGNOSIS — M1712 Unilateral primary osteoarthritis, left knee: Secondary | ICD-10-CM | POA: Diagnosis not present

## 2016-05-11 DIAGNOSIS — R3912 Poor urinary stream: Secondary | ICD-10-CM | POA: Diagnosis not present

## 2016-05-11 DIAGNOSIS — Z96642 Presence of left artificial hip joint: Secondary | ICD-10-CM | POA: Diagnosis not present

## 2016-05-11 DIAGNOSIS — R3911 Hesitancy of micturition: Secondary | ICD-10-CM | POA: Diagnosis not present

## 2016-05-11 DIAGNOSIS — Z96643 Presence of artificial hip joint, bilateral: Secondary | ICD-10-CM | POA: Diagnosis not present

## 2016-05-11 DIAGNOSIS — N401 Enlarged prostate with lower urinary tract symptoms: Secondary | ICD-10-CM | POA: Diagnosis not present

## 2016-06-09 DIAGNOSIS — N183 Chronic kidney disease, stage 3 (moderate): Secondary | ICD-10-CM | POA: Diagnosis not present

## 2016-06-10 DIAGNOSIS — I2510 Atherosclerotic heart disease of native coronary artery without angina pectoris: Secondary | ICD-10-CM | POA: Diagnosis not present

## 2016-06-10 DIAGNOSIS — I4891 Unspecified atrial fibrillation: Secondary | ICD-10-CM | POA: Diagnosis not present

## 2016-06-10 DIAGNOSIS — M4800 Spinal stenosis, site unspecified: Secondary | ICD-10-CM | POA: Diagnosis not present

## 2016-06-10 DIAGNOSIS — E790 Hyperuricemia without signs of inflammatory arthritis and tophaceous disease: Secondary | ICD-10-CM | POA: Diagnosis not present

## 2016-06-10 DIAGNOSIS — H6121 Impacted cerumen, right ear: Secondary | ICD-10-CM | POA: Diagnosis not present

## 2016-06-10 DIAGNOSIS — E782 Mixed hyperlipidemia: Secondary | ICD-10-CM | POA: Diagnosis not present

## 2016-06-10 DIAGNOSIS — N183 Chronic kidney disease, stage 3 (moderate): Secondary | ICD-10-CM | POA: Diagnosis not present

## 2016-06-10 DIAGNOSIS — M5432 Sciatica, left side: Secondary | ICD-10-CM | POA: Diagnosis not present

## 2016-06-10 DIAGNOSIS — Z79899 Other long term (current) drug therapy: Secondary | ICD-10-CM | POA: Diagnosis not present

## 2016-06-10 DIAGNOSIS — I10 Essential (primary) hypertension: Secondary | ICD-10-CM | POA: Diagnosis not present

## 2016-07-11 DIAGNOSIS — R5381 Other malaise: Secondary | ICD-10-CM | POA: Diagnosis not present

## 2016-07-11 DIAGNOSIS — R05 Cough: Secondary | ICD-10-CM | POA: Diagnosis not present

## 2016-07-11 DIAGNOSIS — R5383 Other fatigue: Secondary | ICD-10-CM | POA: Diagnosis not present

## 2016-07-18 DIAGNOSIS — J309 Allergic rhinitis, unspecified: Secondary | ICD-10-CM | POA: Diagnosis not present

## 2016-07-18 DIAGNOSIS — J0100 Acute maxillary sinusitis, unspecified: Secondary | ICD-10-CM | POA: Diagnosis not present

## 2016-07-21 DIAGNOSIS — S20219A Contusion of unspecified front wall of thorax, initial encounter: Secondary | ICD-10-CM | POA: Diagnosis not present

## 2016-07-21 DIAGNOSIS — J209 Acute bronchitis, unspecified: Secondary | ICD-10-CM | POA: Diagnosis not present

## 2016-07-28 DIAGNOSIS — S20219A Contusion of unspecified front wall of thorax, initial encounter: Secondary | ICD-10-CM | POA: Diagnosis not present

## 2016-07-28 DIAGNOSIS — J209 Acute bronchitis, unspecified: Secondary | ICD-10-CM | POA: Diagnosis not present

## 2016-08-22 DIAGNOSIS — I951 Orthostatic hypotension: Secondary | ICD-10-CM | POA: Diagnosis not present

## 2016-08-22 DIAGNOSIS — R5383 Other fatigue: Secondary | ICD-10-CM | POA: Diagnosis not present

## 2016-09-28 DIAGNOSIS — R05 Cough: Secondary | ICD-10-CM | POA: Diagnosis not present

## 2016-09-28 DIAGNOSIS — J209 Acute bronchitis, unspecified: Secondary | ICD-10-CM | POA: Diagnosis not present

## 2016-10-06 DIAGNOSIS — R609 Edema, unspecified: Secondary | ICD-10-CM | POA: Diagnosis not present

## 2016-10-06 DIAGNOSIS — E785 Hyperlipidemia, unspecified: Secondary | ICD-10-CM | POA: Diagnosis not present

## 2016-10-06 DIAGNOSIS — I4891 Unspecified atrial fibrillation: Secondary | ICD-10-CM | POA: Diagnosis not present

## 2016-10-10 DIAGNOSIS — L570 Actinic keratosis: Secondary | ICD-10-CM | POA: Diagnosis not present

## 2016-10-17 DIAGNOSIS — R0989 Other specified symptoms and signs involving the circulatory and respiratory systems: Secondary | ICD-10-CM | POA: Diagnosis not present

## 2016-10-26 DIAGNOSIS — I4891 Unspecified atrial fibrillation: Secondary | ICD-10-CM | POA: Diagnosis not present

## 2016-10-26 DIAGNOSIS — E785 Hyperlipidemia, unspecified: Secondary | ICD-10-CM | POA: Diagnosis not present

## 2016-10-26 DIAGNOSIS — R0600 Dyspnea, unspecified: Secondary | ICD-10-CM | POA: Diagnosis not present

## 2016-11-29 DIAGNOSIS — M4802 Spinal stenosis, cervical region: Secondary | ICD-10-CM | POA: Diagnosis not present

## 2016-12-02 DIAGNOSIS — Z23 Encounter for immunization: Secondary | ICD-10-CM | POA: Diagnosis not present

## 2016-12-06 DIAGNOSIS — J069 Acute upper respiratory infection, unspecified: Secondary | ICD-10-CM | POA: Diagnosis not present

## 2016-12-06 DIAGNOSIS — J029 Acute pharyngitis, unspecified: Secondary | ICD-10-CM | POA: Diagnosis not present

## 2016-12-27 DIAGNOSIS — Z79899 Other long term (current) drug therapy: Secondary | ICD-10-CM | POA: Diagnosis not present

## 2016-12-27 DIAGNOSIS — E782 Mixed hyperlipidemia: Secondary | ICD-10-CM | POA: Diagnosis not present

## 2016-12-27 DIAGNOSIS — I10 Essential (primary) hypertension: Secondary | ICD-10-CM | POA: Diagnosis not present

## 2016-12-27 DIAGNOSIS — E559 Vitamin D deficiency, unspecified: Secondary | ICD-10-CM | POA: Diagnosis not present

## 2016-12-27 DIAGNOSIS — N183 Chronic kidney disease, stage 3 (moderate): Secondary | ICD-10-CM | POA: Diagnosis not present

## 2016-12-27 DIAGNOSIS — E790 Hyperuricemia without signs of inflammatory arthritis and tophaceous disease: Secondary | ICD-10-CM | POA: Diagnosis not present

## 2016-12-28 DIAGNOSIS — E559 Vitamin D deficiency, unspecified: Secondary | ICD-10-CM | POA: Diagnosis not present

## 2016-12-28 DIAGNOSIS — R42 Dizziness and giddiness: Secondary | ICD-10-CM | POA: Diagnosis not present

## 2016-12-28 DIAGNOSIS — E782 Mixed hyperlipidemia: Secondary | ICD-10-CM | POA: Diagnosis not present

## 2016-12-28 DIAGNOSIS — E790 Hyperuricemia without signs of inflammatory arthritis and tophaceous disease: Secondary | ICD-10-CM | POA: Diagnosis not present

## 2016-12-28 DIAGNOSIS — R351 Nocturia: Secondary | ICD-10-CM | POA: Diagnosis not present

## 2016-12-28 DIAGNOSIS — Z79899 Other long term (current) drug therapy: Secondary | ICD-10-CM | POA: Diagnosis not present

## 2016-12-28 DIAGNOSIS — Z1389 Encounter for screening for other disorder: Secondary | ICD-10-CM | POA: Diagnosis not present

## 2016-12-28 DIAGNOSIS — I10 Essential (primary) hypertension: Secondary | ICD-10-CM | POA: Diagnosis not present

## 2016-12-28 DIAGNOSIS — Z0000 Encounter for general adult medical examination without abnormal findings: Secondary | ICD-10-CM | POA: Diagnosis not present

## 2016-12-28 DIAGNOSIS — N401 Enlarged prostate with lower urinary tract symptoms: Secondary | ICD-10-CM | POA: Diagnosis not present

## 2016-12-28 DIAGNOSIS — N183 Chronic kidney disease, stage 3 (moderate): Secondary | ICD-10-CM | POA: Diagnosis not present

## 2016-12-29 DIAGNOSIS — H6523 Chronic serous otitis media, bilateral: Secondary | ICD-10-CM | POA: Diagnosis not present

## 2016-12-29 DIAGNOSIS — H7411 Adhesive right middle ear disease: Secondary | ICD-10-CM | POA: Diagnosis not present

## 2016-12-29 DIAGNOSIS — H6983 Other specified disorders of Eustachian tube, bilateral: Secondary | ICD-10-CM | POA: Diagnosis not present

## 2016-12-29 DIAGNOSIS — H906 Mixed conductive and sensorineural hearing loss, bilateral: Secondary | ICD-10-CM | POA: Diagnosis not present

## 2017-01-10 DIAGNOSIS — M6281 Muscle weakness (generalized): Secondary | ICD-10-CM | POA: Diagnosis not present

## 2017-01-10 DIAGNOSIS — R2681 Unsteadiness on feet: Secondary | ICD-10-CM | POA: Diagnosis not present

## 2017-01-10 DIAGNOSIS — M545 Low back pain: Secondary | ICD-10-CM | POA: Diagnosis not present

## 2017-01-13 DIAGNOSIS — M6281 Muscle weakness (generalized): Secondary | ICD-10-CM | POA: Diagnosis not present

## 2017-01-13 DIAGNOSIS — M545 Low back pain: Secondary | ICD-10-CM | POA: Diagnosis not present

## 2017-01-13 DIAGNOSIS — R2681 Unsteadiness on feet: Secondary | ICD-10-CM | POA: Diagnosis not present

## 2017-01-24 DIAGNOSIS — H6983 Other specified disorders of Eustachian tube, bilateral: Secondary | ICD-10-CM | POA: Diagnosis not present

## 2017-01-24 DIAGNOSIS — H7411 Adhesive right middle ear disease: Secondary | ICD-10-CM | POA: Diagnosis not present

## 2017-01-24 DIAGNOSIS — H60332 Swimmer's ear, left ear: Secondary | ICD-10-CM | POA: Diagnosis not present

## 2017-01-24 DIAGNOSIS — H906 Mixed conductive and sensorineural hearing loss, bilateral: Secondary | ICD-10-CM | POA: Diagnosis not present

## 2017-01-27 DIAGNOSIS — M6281 Muscle weakness (generalized): Secondary | ICD-10-CM | POA: Diagnosis not present

## 2017-01-27 DIAGNOSIS — M545 Low back pain: Secondary | ICD-10-CM | POA: Diagnosis not present

## 2017-01-27 DIAGNOSIS — R2681 Unsteadiness on feet: Secondary | ICD-10-CM | POA: Diagnosis not present

## 2017-01-31 DIAGNOSIS — H6692 Otitis media, unspecified, left ear: Secondary | ICD-10-CM | POA: Diagnosis not present

## 2017-01-31 DIAGNOSIS — R2681 Unsteadiness on feet: Secondary | ICD-10-CM | POA: Diagnosis not present

## 2017-01-31 DIAGNOSIS — M545 Low back pain: Secondary | ICD-10-CM | POA: Diagnosis not present

## 2017-01-31 DIAGNOSIS — M6281 Muscle weakness (generalized): Secondary | ICD-10-CM | POA: Diagnosis not present

## 2017-02-03 DIAGNOSIS — R2681 Unsteadiness on feet: Secondary | ICD-10-CM | POA: Diagnosis not present

## 2017-02-03 DIAGNOSIS — M545 Low back pain: Secondary | ICD-10-CM | POA: Diagnosis not present

## 2017-02-03 DIAGNOSIS — M6281 Muscle weakness (generalized): Secondary | ICD-10-CM | POA: Diagnosis not present

## 2017-02-06 DIAGNOSIS — H7012 Chronic mastoiditis, left ear: Secondary | ICD-10-CM | POA: Diagnosis not present

## 2017-02-06 DIAGNOSIS — H6122 Impacted cerumen, left ear: Secondary | ICD-10-CM | POA: Diagnosis not present

## 2017-02-08 DIAGNOSIS — R2681 Unsteadiness on feet: Secondary | ICD-10-CM | POA: Diagnosis not present

## 2017-02-08 DIAGNOSIS — M545 Low back pain: Secondary | ICD-10-CM | POA: Diagnosis not present

## 2017-02-08 DIAGNOSIS — M6281 Muscle weakness (generalized): Secondary | ICD-10-CM | POA: Diagnosis not present

## 2017-02-13 DIAGNOSIS — N400 Enlarged prostate without lower urinary tract symptoms: Secondary | ICD-10-CM | POA: Diagnosis not present

## 2017-02-14 DIAGNOSIS — M545 Low back pain: Secondary | ICD-10-CM | POA: Diagnosis not present

## 2017-02-14 DIAGNOSIS — M6281 Muscle weakness (generalized): Secondary | ICD-10-CM | POA: Diagnosis not present

## 2017-02-14 DIAGNOSIS — H7012 Chronic mastoiditis, left ear: Secondary | ICD-10-CM | POA: Diagnosis not present

## 2017-02-14 DIAGNOSIS — R2681 Unsteadiness on feet: Secondary | ICD-10-CM | POA: Diagnosis not present

## 2017-02-15 DIAGNOSIS — H7012 Chronic mastoiditis, left ear: Secondary | ICD-10-CM | POA: Diagnosis not present

## 2017-02-15 DIAGNOSIS — H7101 Cholesteatoma of attic, right ear: Secondary | ICD-10-CM | POA: Diagnosis not present

## 2017-02-17 DIAGNOSIS — M545 Low back pain: Secondary | ICD-10-CM | POA: Diagnosis not present

## 2017-02-17 DIAGNOSIS — M6281 Muscle weakness (generalized): Secondary | ICD-10-CM | POA: Diagnosis not present

## 2017-02-17 DIAGNOSIS — R2681 Unsteadiness on feet: Secondary | ICD-10-CM | POA: Diagnosis not present

## 2017-02-24 DIAGNOSIS — M545 Low back pain: Secondary | ICD-10-CM | POA: Diagnosis not present

## 2017-02-24 DIAGNOSIS — M6281 Muscle weakness (generalized): Secondary | ICD-10-CM | POA: Diagnosis not present

## 2017-02-24 DIAGNOSIS — R2681 Unsteadiness on feet: Secondary | ICD-10-CM | POA: Diagnosis not present

## 2017-02-28 DIAGNOSIS — H7411 Adhesive right middle ear disease: Secondary | ICD-10-CM | POA: Diagnosis not present

## 2017-02-28 DIAGNOSIS — H906 Mixed conductive and sensorineural hearing loss, bilateral: Secondary | ICD-10-CM | POA: Diagnosis not present

## 2017-02-28 DIAGNOSIS — H6983 Other specified disorders of Eustachian tube, bilateral: Secondary | ICD-10-CM | POA: Diagnosis not present

## 2017-02-28 DIAGNOSIS — H6523 Chronic serous otitis media, bilateral: Secondary | ICD-10-CM | POA: Diagnosis not present

## 2017-03-01 ENCOUNTER — Encounter: Payer: Self-pay | Admitting: Cardiology

## 2017-03-01 ENCOUNTER — Ambulatory Visit (INDEPENDENT_AMBULATORY_CARE_PROVIDER_SITE_OTHER): Payer: Medicare Other | Admitting: Cardiology

## 2017-03-01 ENCOUNTER — Other Ambulatory Visit: Payer: Self-pay | Admitting: Cardiology

## 2017-03-01 VITALS — BP 150/84 | HR 65 | Ht 69.0 in | Wt 172.8 lb

## 2017-03-01 DIAGNOSIS — I482 Chronic atrial fibrillation: Secondary | ICD-10-CM

## 2017-03-01 DIAGNOSIS — H6523 Chronic serous otitis media, bilateral: Secondary | ICD-10-CM | POA: Diagnosis not present

## 2017-03-01 DIAGNOSIS — I2510 Atherosclerotic heart disease of native coronary artery without angina pectoris: Secondary | ICD-10-CM | POA: Diagnosis not present

## 2017-03-01 DIAGNOSIS — H906 Mixed conductive and sensorineural hearing loss, bilateral: Secondary | ICD-10-CM | POA: Diagnosis not present

## 2017-03-01 DIAGNOSIS — Z7901 Long term (current) use of anticoagulants: Secondary | ICD-10-CM

## 2017-03-01 DIAGNOSIS — H6983 Other specified disorders of Eustachian tube, bilateral: Secondary | ICD-10-CM | POA: Diagnosis not present

## 2017-03-01 DIAGNOSIS — I2583 Coronary atherosclerosis due to lipid rich plaque: Secondary | ICD-10-CM | POA: Diagnosis not present

## 2017-03-01 DIAGNOSIS — I4821 Permanent atrial fibrillation: Secondary | ICD-10-CM

## 2017-03-01 DIAGNOSIS — H7411 Adhesive right middle ear disease: Secondary | ICD-10-CM | POA: Diagnosis not present

## 2017-03-01 NOTE — Progress Notes (Signed)
[name]. [address], [address] [place], [place]  [address] Phone: [phone] Fax:  [phone]  Date:  03/01/2017    ID:  [name], DOB 12-03-1926, MRN [id number]  PCP:  Josetta Huddle, MD   History of Present Illness: [name] is a 83 y.o. male who had atrial fibrillation in the setting of pericardial effusion, subacute rib fractures. Dr. Inda Merlin is PCP, pacemaker. Treated with prednisone therapy. Has an internist as well as in [place]. On chronic anticoagulation. Repeat echocardiogram showed resolution of pericardial effusion.  He has been doing well on anticoagulation, Xarelto 15 mg. No bleeding, no syncope, no falls. Not having any chest discomfort. Periodically when he swims he may feel some right shoulder discomfort which is musculoskeletal.  In regards to atrial fibrillation, he is asymptomatic. Remains in atrial fibrillation, permanent.   Prior ESR normal at Dr. Lenna Gilford.  Has appt in [place] on 57 and 1-2nd. Travels back and forth.   03/01/17-overall doing well without any specific cardiac complaints, no chest pain, no syncope, no shortness of breath.  Easy bruising noted with Xarelto.  He is having some ear issues.  Dr. Cresenciano Lick.   Wt Readings from Last 3 Encounters:  03/01/17 172 lb 12.8 oz (78.4 kg)  02/24/16 172 lb (78 kg)  08/25/14 170 lb (77.1 kg)     Past Medical History:  Diagnosis Date  . Allergic rhinitis   . Arthritis   . Atrial fibrillation ([place])   . Coronary artery disease    mild, non-obstructive CAD by 2001 cath  . Hearing decreased   . Hypertension   . Hyponatremia   . Impacted cerumen   . Myalgia   . Pericardial effusion    August 2014-repeat echocardiogram 12/11/12-no effusion  . Pleural effusion    August 2014  . Pure hypercholesterolemia   . PVC (premature ventricular contraction)   . RBBB    with left anterior hemiblock  . Renal insufficiency    stage III, creatinine 1.41, June, 2013 - Celebrex reduced  . Spinal  stenosis    with back pain - considering neurosurgery with Dr. Sherley Bounds, March, 2014  . Tachycardia   . Vitamin D deficiency     Past Surgical History:  Procedure Laterality Date  . CARDIAC CATHETERIZATION      normal left main, LAD with 40-50% narrowing proximal LAD, diagonal vessel with 20-30% irregular lesion, left circumflex normal, right coronary artery normal, left ventricular function normal - Dr. Bea Laura  . CATARACT EXTRACTION    . HERNIA REPAIR    . INNER EAR SURGERY    . JOINT REPLACEMENT Bilateral    hip  . LUMBAR LAMINECTOMY/DECOMPRESSION MICRODISCECTOMY N/A 08/08/2012   Procedure: LUMBAR FOUR TO FIVE, LUMBAR FIVE TO SACRAL ONE LUMBAR DECOMPRESSION;  Surgeon: Eustace Moore, MD;  Location: [facility];  Service: Neurosurgery;  Laterality: N/A;  LUMBAR LAMINECTOMY/DECOMPRESSION MICRODISCECTOMY 2 LEVELS    Current Outpatient Medications  Medication Sig Dispense Refill  . ALPRAZolam (XANAX) 0.25 MG tablet Take 0.25 mg by mouth at bedtime as needed for anxiety.    Marland Kitchen amLODipine (NORVASC) 5 MG tablet Take 5 mg by mouth daily.    . cholecalciferol (VITAMIN D) 1000 UNITS tablet Take 1,000 Units by mouth daily.    . finasteride (PROSCAR) 5 MG tablet Take 5 mg by mouth at bedtime.    Marland Kitchen losartan-hydrochlorothiazide (HYZAAR) 50-12.5 MG tablet Take 1 tablet by mouth daily.    . metoprolol succinate (  TOPROL-XL) 50 MG 24 hr tablet Take 50 mg by mouth daily. Take with or immediately following a meal.    . Multiple Vitamins-Minerals (MULTIVITAMIN WITH MINERALS) tablet Take 1 tablet by mouth daily.    Marland Kitchen omeprazole (PRILOSEC) 20 MG capsule Take 20 mg by mouth daily.    . probenecid (BENEMID) 500 MG tablet Take 250 mg by mouth daily.     . rosuvastatin (CRESTOR) 5 MG tablet Take 5 mg by mouth at bedtime.    Marland Kitchen terazosin (HYTRIN) 10 MG capsule Take 10 mg by mouth at bedtime.    . TRAMADOL HCL ER PO Take by mouth as needed.    Alveda Reasons 15 MG TABS tablet TAKE 1 TABLET ONCE DAILY. 30  tablet 0  . zolpidem (AMBIEN) 5 MG tablet Take 5 mg by mouth at bedtime as needed for sleep.     No current facility-administered medications for this visit.     Allergies:   No Known Allergies  Social History:  The patient  reports that  has never smoked. he has never used smokeless tobacco. He reports that he drinks about 0.5 oz of alcohol per week. He reports that he does not use drugs.   ROS:  Please see the history of present illness.   No syncope, no bleeding, no orthopnea, no PND    PHYSICAL EXAM: VS:  BP (!) 150/84   Pulse 65   Ht '5\' 9"'$  (1.753 m)   Wt 172 lb 12.8 oz (78.4 kg)   SpO2 98%   BMI 25.52 kg/m  Well nourished, well developed, in no acute distress  HEENT: normal  Neck: no JVD  Cardiac: Irregularly irregular, no appreciable rubs, no murmur  Lungs:  clear to auscultation bilaterally, no wheezing, rhonchi or rales  Abd: soft, nontender, no hepatomegaly  Ext: no edema  Skin: warm and dry  Neuro: no focal abnormalities noted  EKG: Today 03/01/17-atrial fibrillation 65 right bundle branch block, personally viewed-01/21/14-atrial fibrillation heart rate 71, right bundle branch block. Previously atrial fibrillation    Echocardiogram 08/31/15 in [place]: LV normal, mild left atrial enlargement, mild thickening of mitral valve, moderate tricuspid regurgitation, tricuspid regurgitation velocity 3.0 m/s, 44 mmHg estimated.  Stress echocardiogram 08/31/15: No evidence of ischemia.  ASSESSMENT AND PLAN:  1. Atrial fibrillation-we will continue with metoprolol as well as Xarelto 15 mg. Lower dose was being utilized because of creatinine clearance.  Remains below 50.  Asymptomatic. Continue with rate control.  Has been quite stable.  No changes made.  Echocardiogram performed in [place] showed EF of 55-60%. Right ventricle was normal. Moderate left atrial enlargement. Report has been scanned. 2. Chronic anticoagulation-as above. Monitoring. Checking blood work every 6  months. Dr. Inda Merlin.  Creatinine clearance remains less than 50.  Hemoglobin has been stable.  13-14. 3. HTN - BP elevated today, but normal otherwise, recently 120 range with Dr. Inda Merlin.  4. RBBB - stable, no syncope.  No changes. 5. Inflammatory illness, pleural/pericardial effusion-resolved. ESR now normal.  Stable 6. Coronary artery disease-nonobstructive, mild-2001 catheterization. Exercise treadmill test showed increased exercise capacity. No ischemic changes.  Doing very well. 7. Rib fractures-resolved. Rare shoulder pain when swimming.  8. We will see him back in 6 months. He's going to be out of town for the next few months.  Going to try [place] for a change.  Signed, Candee Furbish, MD [facility]  03/01/2017 10:34 AM

## 2017-03-01 NOTE — Telephone Encounter (Signed)
Age 83  Wt 78.4 kg 03/01/2017 Saw Dr Marlou Porch 03/01/2017 Sr Cr 1.6 01/21/2017 Hgb 13.8 HCT 41.5 on 02/19/2017  CrCl 35.38  Refill done for Xarelto 15mg  daily as requested

## 2017-03-01 NOTE — Patient Instructions (Signed)

## 2017-03-07 DIAGNOSIS — M6281 Muscle weakness (generalized): Secondary | ICD-10-CM | POA: Diagnosis not present

## 2017-03-07 DIAGNOSIS — R2681 Unsteadiness on feet: Secondary | ICD-10-CM | POA: Diagnosis not present

## 2017-03-07 DIAGNOSIS — M545 Low back pain: Secondary | ICD-10-CM | POA: Diagnosis not present

## 2017-03-10 DIAGNOSIS — I10 Essential (primary) hypertension: Secondary | ICD-10-CM | POA: Diagnosis not present

## 2017-03-10 DIAGNOSIS — E785 Hyperlipidemia, unspecified: Secondary | ICD-10-CM | POA: Diagnosis not present

## 2017-03-10 DIAGNOSIS — I4891 Unspecified atrial fibrillation: Secondary | ICD-10-CM | POA: Diagnosis not present

## 2017-03-15 DIAGNOSIS — M6281 Muscle weakness (generalized): Secondary | ICD-10-CM | POA: Diagnosis not present

## 2017-03-15 DIAGNOSIS — R2681 Unsteadiness on feet: Secondary | ICD-10-CM | POA: Diagnosis not present

## 2017-03-15 DIAGNOSIS — M545 Low back pain: Secondary | ICD-10-CM | POA: Diagnosis not present

## 2017-03-17 DIAGNOSIS — E782 Mixed hyperlipidemia: Secondary | ICD-10-CM | POA: Diagnosis not present

## 2017-03-17 DIAGNOSIS — I10 Essential (primary) hypertension: Secondary | ICD-10-CM | POA: Diagnosis not present

## 2017-03-17 DIAGNOSIS — I2510 Atherosclerotic heart disease of native coronary artery without angina pectoris: Secondary | ICD-10-CM | POA: Diagnosis not present

## 2017-03-17 DIAGNOSIS — N400 Enlarged prostate without lower urinary tract symptoms: Secondary | ICD-10-CM | POA: Diagnosis not present

## 2017-03-17 DIAGNOSIS — I482 Chronic atrial fibrillation: Secondary | ICD-10-CM | POA: Diagnosis not present

## 2017-03-17 DIAGNOSIS — N183 Chronic kidney disease, stage 3 (moderate): Secondary | ICD-10-CM | POA: Diagnosis not present

## 2017-03-17 DIAGNOSIS — M169 Osteoarthritis of hip, unspecified: Secondary | ICD-10-CM | POA: Diagnosis not present

## 2017-03-17 DIAGNOSIS — N401 Enlarged prostate with lower urinary tract symptoms: Secondary | ICD-10-CM | POA: Diagnosis not present

## 2017-03-17 DIAGNOSIS — I4891 Unspecified atrial fibrillation: Secondary | ICD-10-CM | POA: Diagnosis not present

## 2017-04-20 DIAGNOSIS — M159 Polyosteoarthritis, unspecified: Secondary | ICD-10-CM | POA: Diagnosis not present

## 2017-04-21 DIAGNOSIS — E785 Hyperlipidemia, unspecified: Secondary | ICD-10-CM | POA: Diagnosis not present

## 2017-04-21 DIAGNOSIS — I4891 Unspecified atrial fibrillation: Secondary | ICD-10-CM | POA: Diagnosis not present

## 2017-04-21 DIAGNOSIS — M6281 Muscle weakness (generalized): Secondary | ICD-10-CM | POA: Diagnosis not present

## 2017-04-21 DIAGNOSIS — M545 Low back pain: Secondary | ICD-10-CM | POA: Diagnosis not present

## 2017-04-21 DIAGNOSIS — R2681 Unsteadiness on feet: Secondary | ICD-10-CM | POA: Diagnosis not present

## 2017-04-21 DIAGNOSIS — R609 Edema, unspecified: Secondary | ICD-10-CM | POA: Diagnosis not present

## 2017-04-26 DIAGNOSIS — M545 Low back pain: Secondary | ICD-10-CM | POA: Diagnosis not present

## 2017-04-26 DIAGNOSIS — M6281 Muscle weakness (generalized): Secondary | ICD-10-CM | POA: Diagnosis not present

## 2017-04-26 DIAGNOSIS — R2681 Unsteadiness on feet: Secondary | ICD-10-CM | POA: Diagnosis not present

## 2017-05-02 DIAGNOSIS — H906 Mixed conductive and sensorineural hearing loss, bilateral: Secondary | ICD-10-CM | POA: Diagnosis not present

## 2017-05-02 DIAGNOSIS — H6983 Other specified disorders of Eustachian tube, bilateral: Secondary | ICD-10-CM | POA: Diagnosis not present

## 2017-05-02 DIAGNOSIS — H7411 Adhesive right middle ear disease: Secondary | ICD-10-CM | POA: Diagnosis not present

## 2017-05-05 DIAGNOSIS — M6281 Muscle weakness (generalized): Secondary | ICD-10-CM | POA: Diagnosis not present

## 2017-05-05 DIAGNOSIS — M545 Low back pain: Secondary | ICD-10-CM | POA: Diagnosis not present

## 2017-05-05 DIAGNOSIS — R2681 Unsteadiness on feet: Secondary | ICD-10-CM | POA: Diagnosis not present

## 2017-05-09 DIAGNOSIS — H35363 Drusen (degenerative) of macula, bilateral: Secondary | ICD-10-CM | POA: Diagnosis not present

## 2017-05-09 DIAGNOSIS — Z961 Presence of intraocular lens: Secondary | ICD-10-CM | POA: Diagnosis not present

## 2017-05-09 DIAGNOSIS — H04123 Dry eye syndrome of bilateral lacrimal glands: Secondary | ICD-10-CM | POA: Diagnosis not present

## 2017-05-09 DIAGNOSIS — H43393 Other vitreous opacities, bilateral: Secondary | ICD-10-CM | POA: Diagnosis not present

## 2017-05-09 DIAGNOSIS — H35033 Hypertensive retinopathy, bilateral: Secondary | ICD-10-CM | POA: Diagnosis not present

## 2017-05-12 DIAGNOSIS — R2681 Unsteadiness on feet: Secondary | ICD-10-CM | POA: Diagnosis not present

## 2017-05-12 DIAGNOSIS — M545 Low back pain: Secondary | ICD-10-CM | POA: Diagnosis not present

## 2017-05-12 DIAGNOSIS — M6281 Muscle weakness (generalized): Secondary | ICD-10-CM | POA: Diagnosis not present

## 2017-05-15 DIAGNOSIS — M6281 Muscle weakness (generalized): Secondary | ICD-10-CM | POA: Diagnosis not present

## 2017-05-15 DIAGNOSIS — R2681 Unsteadiness on feet: Secondary | ICD-10-CM | POA: Diagnosis not present

## 2017-05-15 DIAGNOSIS — M545 Low back pain: Secondary | ICD-10-CM | POA: Diagnosis not present

## 2017-05-17 DIAGNOSIS — R2681 Unsteadiness on feet: Secondary | ICD-10-CM | POA: Diagnosis not present

## 2017-05-17 DIAGNOSIS — M6281 Muscle weakness (generalized): Secondary | ICD-10-CM | POA: Diagnosis not present

## 2017-05-17 DIAGNOSIS — M545 Low back pain: Secondary | ICD-10-CM | POA: Diagnosis not present

## 2017-05-25 DIAGNOSIS — M545 Low back pain: Secondary | ICD-10-CM | POA: Diagnosis not present

## 2017-05-25 DIAGNOSIS — R2681 Unsteadiness on feet: Secondary | ICD-10-CM | POA: Diagnosis not present

## 2017-05-25 DIAGNOSIS — M6281 Muscle weakness (generalized): Secondary | ICD-10-CM | POA: Diagnosis not present

## 2017-05-30 DIAGNOSIS — R2681 Unsteadiness on feet: Secondary | ICD-10-CM | POA: Diagnosis not present

## 2017-05-30 DIAGNOSIS — M6281 Muscle weakness (generalized): Secondary | ICD-10-CM | POA: Diagnosis not present

## 2017-05-30 DIAGNOSIS — M545 Low back pain: Secondary | ICD-10-CM | POA: Diagnosis not present

## 2017-06-05 DIAGNOSIS — Z923 Personal history of irradiation: Secondary | ICD-10-CM | POA: Diagnosis not present

## 2017-06-05 DIAGNOSIS — Z08 Encounter for follow-up examination after completed treatment for malignant neoplasm: Secondary | ICD-10-CM | POA: Diagnosis not present

## 2017-06-05 DIAGNOSIS — D485 Neoplasm of uncertain behavior of skin: Secondary | ICD-10-CM | POA: Diagnosis not present

## 2017-06-05 DIAGNOSIS — Z85828 Personal history of other malignant neoplasm of skin: Secondary | ICD-10-CM | POA: Diagnosis not present

## 2017-06-05 DIAGNOSIS — L570 Actinic keratosis: Secondary | ICD-10-CM | POA: Diagnosis not present

## 2017-06-06 DIAGNOSIS — M545 Low back pain: Secondary | ICD-10-CM | POA: Diagnosis not present

## 2017-06-06 DIAGNOSIS — R2681 Unsteadiness on feet: Secondary | ICD-10-CM | POA: Diagnosis not present

## 2017-06-06 DIAGNOSIS — M6281 Muscle weakness (generalized): Secondary | ICD-10-CM | POA: Diagnosis not present

## 2017-06-08 DIAGNOSIS — R2681 Unsteadiness on feet: Secondary | ICD-10-CM | POA: Diagnosis not present

## 2017-06-08 DIAGNOSIS — M545 Low back pain: Secondary | ICD-10-CM | POA: Diagnosis not present

## 2017-06-08 DIAGNOSIS — M6281 Muscle weakness (generalized): Secondary | ICD-10-CM | POA: Diagnosis not present

## 2017-06-15 DIAGNOSIS — N401 Enlarged prostate with lower urinary tract symptoms: Secondary | ICD-10-CM | POA: Diagnosis not present

## 2017-06-15 DIAGNOSIS — R3912 Poor urinary stream: Secondary | ICD-10-CM | POA: Diagnosis not present

## 2017-06-15 DIAGNOSIS — R3914 Feeling of incomplete bladder emptying: Secondary | ICD-10-CM | POA: Diagnosis not present

## 2017-06-16 ENCOUNTER — Other Ambulatory Visit: Payer: Self-pay | Admitting: Cardiology

## 2017-06-19 DIAGNOSIS — H7411 Adhesive right middle ear disease: Secondary | ICD-10-CM | POA: Diagnosis not present

## 2017-06-19 DIAGNOSIS — H906 Mixed conductive and sensorineural hearing loss, bilateral: Secondary | ICD-10-CM | POA: Diagnosis not present

## 2017-06-19 DIAGNOSIS — H6983 Other specified disorders of Eustachian tube, bilateral: Secondary | ICD-10-CM | POA: Diagnosis not present

## 2017-06-19 NOTE — Telephone Encounter (Signed)
SCr 1.52 in [facility] from Oct 2018, CrCl 37. Xarelto 15mg  dose appropriate for refill.

## 2017-06-20 DIAGNOSIS — N183 Chronic kidney disease, stage 3 (moderate): Secondary | ICD-10-CM | POA: Diagnosis not present

## 2017-06-21 DIAGNOSIS — I2510 Atherosclerotic heart disease of native coronary artery without angina pectoris: Secondary | ICD-10-CM | POA: Diagnosis not present

## 2017-06-21 DIAGNOSIS — I482 Chronic atrial fibrillation: Secondary | ICD-10-CM | POA: Diagnosis not present

## 2017-06-21 DIAGNOSIS — N183 Chronic kidney disease, stage 3 (moderate): Secondary | ICD-10-CM | POA: Diagnosis not present

## 2017-06-21 DIAGNOSIS — N401 Enlarged prostate with lower urinary tract symptoms: Secondary | ICD-10-CM | POA: Diagnosis not present

## 2017-06-21 DIAGNOSIS — I10 Essential (primary) hypertension: Secondary | ICD-10-CM | POA: Diagnosis not present

## 2017-06-28 DIAGNOSIS — R2681 Unsteadiness on feet: Secondary | ICD-10-CM | POA: Diagnosis not present

## 2017-06-28 DIAGNOSIS — M545 Low back pain: Secondary | ICD-10-CM | POA: Diagnosis not present

## 2017-06-28 DIAGNOSIS — M6281 Muscle weakness (generalized): Secondary | ICD-10-CM | POA: Diagnosis not present

## 2017-07-04 DIAGNOSIS — R2681 Unsteadiness on feet: Secondary | ICD-10-CM | POA: Diagnosis not present

## 2017-07-04 DIAGNOSIS — M6281 Muscle weakness (generalized): Secondary | ICD-10-CM | POA: Diagnosis not present

## 2017-07-04 DIAGNOSIS — M545 Low back pain: Secondary | ICD-10-CM | POA: Diagnosis not present

## 2017-07-11 DIAGNOSIS — M545 Low back pain: Secondary | ICD-10-CM | POA: Diagnosis not present

## 2017-07-11 DIAGNOSIS — M6281 Muscle weakness (generalized): Secondary | ICD-10-CM | POA: Diagnosis not present

## 2017-07-11 DIAGNOSIS — R2681 Unsteadiness on feet: Secondary | ICD-10-CM | POA: Diagnosis not present

## 2017-07-18 DIAGNOSIS — M545 Low back pain: Secondary | ICD-10-CM | POA: Diagnosis not present

## 2017-07-18 DIAGNOSIS — R2681 Unsteadiness on feet: Secondary | ICD-10-CM | POA: Diagnosis not present

## 2017-07-18 DIAGNOSIS — M6281 Muscle weakness (generalized): Secondary | ICD-10-CM | POA: Diagnosis not present

## 2017-08-01 DIAGNOSIS — R2681 Unsteadiness on feet: Secondary | ICD-10-CM | POA: Diagnosis not present

## 2017-08-01 DIAGNOSIS — M6281 Muscle weakness (generalized): Secondary | ICD-10-CM | POA: Diagnosis not present

## 2017-08-01 DIAGNOSIS — M545 Low back pain: Secondary | ICD-10-CM | POA: Diagnosis not present

## 2017-08-08 DIAGNOSIS — R2681 Unsteadiness on feet: Secondary | ICD-10-CM | POA: Diagnosis not present

## 2017-08-08 DIAGNOSIS — M545 Low back pain: Secondary | ICD-10-CM | POA: Diagnosis not present

## 2017-08-08 DIAGNOSIS — M6281 Muscle weakness (generalized): Secondary | ICD-10-CM | POA: Diagnosis not present

## 2017-08-15 DIAGNOSIS — M6281 Muscle weakness (generalized): Secondary | ICD-10-CM | POA: Diagnosis not present

## 2017-08-15 DIAGNOSIS — M545 Low back pain: Secondary | ICD-10-CM | POA: Diagnosis not present

## 2017-08-15 DIAGNOSIS — R2681 Unsteadiness on feet: Secondary | ICD-10-CM | POA: Diagnosis not present

## 2017-08-22 DIAGNOSIS — H60331 Swimmer's ear, right ear: Secondary | ICD-10-CM | POA: Diagnosis not present

## 2017-08-22 DIAGNOSIS — H906 Mixed conductive and sensorineural hearing loss, bilateral: Secondary | ICD-10-CM | POA: Diagnosis not present

## 2017-08-22 DIAGNOSIS — H7411 Adhesive right middle ear disease: Secondary | ICD-10-CM | POA: Diagnosis not present

## 2017-08-22 DIAGNOSIS — H6983 Other specified disorders of Eustachian tube, bilateral: Secondary | ICD-10-CM | POA: Diagnosis not present

## 2017-08-28 DIAGNOSIS — L570 Actinic keratosis: Secondary | ICD-10-CM | POA: Diagnosis not present

## 2017-08-29 DIAGNOSIS — M545 Low back pain: Secondary | ICD-10-CM | POA: Diagnosis not present

## 2017-08-29 DIAGNOSIS — M6281 Muscle weakness (generalized): Secondary | ICD-10-CM | POA: Diagnosis not present

## 2017-08-29 DIAGNOSIS — R2681 Unsteadiness on feet: Secondary | ICD-10-CM | POA: Diagnosis not present

## 2017-09-11 DIAGNOSIS — M545 Low back pain: Secondary | ICD-10-CM | POA: Diagnosis not present

## 2017-09-11 DIAGNOSIS — R2681 Unsteadiness on feet: Secondary | ICD-10-CM | POA: Diagnosis not present

## 2017-09-11 DIAGNOSIS — M6281 Muscle weakness (generalized): Secondary | ICD-10-CM | POA: Diagnosis not present

## 2017-09-19 DIAGNOSIS — M545 Low back pain: Secondary | ICD-10-CM | POA: Diagnosis not present

## 2017-09-19 DIAGNOSIS — M6281 Muscle weakness (generalized): Secondary | ICD-10-CM | POA: Diagnosis not present

## 2017-09-19 DIAGNOSIS — R2681 Unsteadiness on feet: Secondary | ICD-10-CM | POA: Diagnosis not present

## 2017-09-26 DIAGNOSIS — M6281 Muscle weakness (generalized): Secondary | ICD-10-CM | POA: Diagnosis not present

## 2017-09-26 DIAGNOSIS — R2681 Unsteadiness on feet: Secondary | ICD-10-CM | POA: Diagnosis not present

## 2017-09-26 DIAGNOSIS — M545 Low back pain: Secondary | ICD-10-CM | POA: Diagnosis not present

## 2017-10-03 DIAGNOSIS — M6281 Muscle weakness (generalized): Secondary | ICD-10-CM | POA: Diagnosis not present

## 2017-10-03 DIAGNOSIS — R2681 Unsteadiness on feet: Secondary | ICD-10-CM | POA: Diagnosis not present

## 2017-10-03 DIAGNOSIS — M545 Low back pain: Secondary | ICD-10-CM | POA: Diagnosis not present

## 2017-10-11 DIAGNOSIS — H903 Sensorineural hearing loss, bilateral: Secondary | ICD-10-CM | POA: Diagnosis not present

## 2017-10-11 DIAGNOSIS — H6502 Acute serous otitis media, left ear: Secondary | ICD-10-CM | POA: Diagnosis not present

## 2017-10-11 DIAGNOSIS — H6061 Unspecified chronic otitis externa, right ear: Secondary | ICD-10-CM | POA: Diagnosis not present

## 2017-10-11 DIAGNOSIS — H6121 Impacted cerumen, right ear: Secondary | ICD-10-CM | POA: Diagnosis not present

## 2017-10-17 DIAGNOSIS — E785 Hyperlipidemia, unspecified: Secondary | ICD-10-CM | POA: Diagnosis not present

## 2017-10-17 DIAGNOSIS — I10 Essential (primary) hypertension: Secondary | ICD-10-CM | POA: Diagnosis not present

## 2017-10-17 DIAGNOSIS — I4891 Unspecified atrial fibrillation: Secondary | ICD-10-CM | POA: Diagnosis not present

## 2017-10-30 DIAGNOSIS — L578 Other skin changes due to chronic exposure to nonionizing radiation: Secondary | ICD-10-CM | POA: Diagnosis not present

## 2017-10-30 DIAGNOSIS — L570 Actinic keratosis: Secondary | ICD-10-CM | POA: Diagnosis not present

## 2017-10-30 DIAGNOSIS — C4492 Squamous cell carcinoma of skin, unspecified: Secondary | ICD-10-CM | POA: Diagnosis not present

## 2017-10-30 DIAGNOSIS — D485 Neoplasm of uncertain behavior of skin: Secondary | ICD-10-CM | POA: Diagnosis not present

## 2017-11-09 DIAGNOSIS — L578 Other skin changes due to chronic exposure to nonionizing radiation: Secondary | ICD-10-CM | POA: Diagnosis not present

## 2017-11-09 DIAGNOSIS — Z8589 Personal history of malignant neoplasm of other organs and systems: Secondary | ICD-10-CM | POA: Diagnosis not present

## 2017-11-21 DIAGNOSIS — H7311 Chronic myringitis, right ear: Secondary | ICD-10-CM | POA: Diagnosis not present

## 2017-11-21 DIAGNOSIS — H6983 Other specified disorders of Eustachian tube, bilateral: Secondary | ICD-10-CM | POA: Diagnosis not present

## 2017-11-21 DIAGNOSIS — H6521 Chronic serous otitis media, right ear: Secondary | ICD-10-CM | POA: Diagnosis not present

## 2017-11-21 DIAGNOSIS — H7411 Adhesive right middle ear disease: Secondary | ICD-10-CM | POA: Diagnosis not present

## 2017-11-21 DIAGNOSIS — H906 Mixed conductive and sensorineural hearing loss, bilateral: Secondary | ICD-10-CM | POA: Diagnosis not present

## 2017-11-21 DIAGNOSIS — H6041 Cholesteatoma of right external ear: Secondary | ICD-10-CM | POA: Diagnosis not present

## 2017-11-24 DIAGNOSIS — H7411 Adhesive right middle ear disease: Secondary | ICD-10-CM | POA: Diagnosis not present

## 2017-11-24 DIAGNOSIS — H7311 Chronic myringitis, right ear: Secondary | ICD-10-CM | POA: Diagnosis not present

## 2017-11-24 DIAGNOSIS — H906 Mixed conductive and sensorineural hearing loss, bilateral: Secondary | ICD-10-CM | POA: Diagnosis not present

## 2017-11-24 DIAGNOSIS — H6521 Chronic serous otitis media, right ear: Secondary | ICD-10-CM | POA: Diagnosis not present

## 2017-11-24 DIAGNOSIS — H6983 Other specified disorders of Eustachian tube, bilateral: Secondary | ICD-10-CM | POA: Diagnosis not present

## 2017-11-24 DIAGNOSIS — H6041 Cholesteatoma of right external ear: Secondary | ICD-10-CM | POA: Diagnosis not present

## 2017-11-24 DIAGNOSIS — H6522 Chronic serous otitis media, left ear: Secondary | ICD-10-CM | POA: Diagnosis not present

## 2017-12-04 DIAGNOSIS — C4442 Squamous cell carcinoma of skin of scalp and neck: Secondary | ICD-10-CM | POA: Diagnosis not present

## 2017-12-11 DIAGNOSIS — Z23 Encounter for immunization: Secondary | ICD-10-CM | POA: Diagnosis not present

## 2017-12-11 DIAGNOSIS — I4891 Unspecified atrial fibrillation: Secondary | ICD-10-CM | POA: Diagnosis not present

## 2017-12-15 DIAGNOSIS — Z79899 Other long term (current) drug therapy: Secondary | ICD-10-CM | POA: Diagnosis not present

## 2017-12-15 DIAGNOSIS — I10 Essential (primary) hypertension: Secondary | ICD-10-CM | POA: Diagnosis not present

## 2017-12-26 DIAGNOSIS — I4891 Unspecified atrial fibrillation: Secondary | ICD-10-CM | POA: Diagnosis not present

## 2017-12-26 DIAGNOSIS — I2510 Atherosclerotic heart disease of native coronary artery without angina pectoris: Secondary | ICD-10-CM | POA: Diagnosis not present

## 2017-12-26 DIAGNOSIS — Z79899 Other long term (current) drug therapy: Secondary | ICD-10-CM | POA: Diagnosis not present

## 2017-12-26 DIAGNOSIS — H6120 Impacted cerumen, unspecified ear: Secondary | ICD-10-CM | POA: Diagnosis not present

## 2017-12-26 DIAGNOSIS — E782 Mixed hyperlipidemia: Secondary | ICD-10-CM | POA: Diagnosis not present

## 2017-12-26 DIAGNOSIS — M4800 Spinal stenosis, site unspecified: Secondary | ICD-10-CM | POA: Diagnosis not present

## 2017-12-26 DIAGNOSIS — M169 Osteoarthritis of hip, unspecified: Secondary | ICD-10-CM | POA: Diagnosis not present

## 2017-12-26 DIAGNOSIS — I10 Essential (primary) hypertension: Secondary | ICD-10-CM | POA: Diagnosis not present

## 2017-12-26 DIAGNOSIS — E790 Hyperuricemia without signs of inflammatory arthritis and tophaceous disease: Secondary | ICD-10-CM | POA: Diagnosis not present

## 2017-12-26 DIAGNOSIS — N401 Enlarged prostate with lower urinary tract symptoms: Secondary | ICD-10-CM | POA: Diagnosis not present

## 2017-12-26 DIAGNOSIS — Z0000 Encounter for general adult medical examination without abnormal findings: Secondary | ICD-10-CM | POA: Diagnosis not present

## 2017-12-26 DIAGNOSIS — E559 Vitamin D deficiency, unspecified: Secondary | ICD-10-CM | POA: Diagnosis not present

## 2017-12-28 DIAGNOSIS — H7411 Adhesive right middle ear disease: Secondary | ICD-10-CM | POA: Diagnosis not present

## 2017-12-28 DIAGNOSIS — M25559 Pain in unspecified hip: Secondary | ICD-10-CM | POA: Insufficient documentation

## 2017-12-28 DIAGNOSIS — H7311 Chronic myringitis, right ear: Secondary | ICD-10-CM | POA: Diagnosis not present

## 2017-12-28 DIAGNOSIS — M25552 Pain in left hip: Secondary | ICD-10-CM | POA: Diagnosis not present

## 2017-12-28 DIAGNOSIS — H6521 Chronic serous otitis media, right ear: Secondary | ICD-10-CM | POA: Diagnosis not present

## 2017-12-28 DIAGNOSIS — H6983 Other specified disorders of Eustachian tube, bilateral: Secondary | ICD-10-CM | POA: Diagnosis not present

## 2017-12-28 DIAGNOSIS — H6041 Cholesteatoma of right external ear: Secondary | ICD-10-CM | POA: Diagnosis not present

## 2017-12-28 DIAGNOSIS — H906 Mixed conductive and sensorineural hearing loss, bilateral: Secondary | ICD-10-CM | POA: Diagnosis not present

## 2018-01-05 DIAGNOSIS — Z23 Encounter for immunization: Secondary | ICD-10-CM | POA: Diagnosis not present

## 2018-01-08 DIAGNOSIS — L578 Other skin changes due to chronic exposure to nonionizing radiation: Secondary | ICD-10-CM | POA: Diagnosis not present

## 2018-01-08 DIAGNOSIS — L570 Actinic keratosis: Secondary | ICD-10-CM | POA: Diagnosis not present

## 2018-01-08 DIAGNOSIS — L853 Xerosis cutis: Secondary | ICD-10-CM | POA: Diagnosis not present

## 2018-01-16 DIAGNOSIS — I10 Essential (primary) hypertension: Secondary | ICD-10-CM | POA: Diagnosis not present

## 2018-01-16 DIAGNOSIS — E785 Hyperlipidemia, unspecified: Secondary | ICD-10-CM | POA: Diagnosis not present

## 2018-01-16 DIAGNOSIS — R609 Edema, unspecified: Secondary | ICD-10-CM | POA: Diagnosis not present

## 2018-02-23 ENCOUNTER — Encounter: Payer: Self-pay | Admitting: Cardiology

## 2018-02-23 ENCOUNTER — Ambulatory Visit (INDEPENDENT_AMBULATORY_CARE_PROVIDER_SITE_OTHER): Payer: Medicare Other | Admitting: Cardiology

## 2018-02-23 VITALS — BP 132/72 | HR 67 | Ht 69.0 in | Wt 168.0 lb

## 2018-02-23 DIAGNOSIS — I4821 Permanent atrial fibrillation: Secondary | ICD-10-CM | POA: Diagnosis not present

## 2018-02-23 DIAGNOSIS — Z7901 Long term (current) use of anticoagulants: Secondary | ICD-10-CM | POA: Diagnosis not present

## 2018-02-23 DIAGNOSIS — I2510 Atherosclerotic heart disease of native coronary artery without angina pectoris: Secondary | ICD-10-CM | POA: Diagnosis not present

## 2018-02-23 DIAGNOSIS — I2583 Coronary atherosclerosis due to lipid rich plaque: Secondary | ICD-10-CM

## 2018-02-23 DIAGNOSIS — I4510 Unspecified right bundle-branch block: Secondary | ICD-10-CM | POA: Diagnosis not present

## 2018-02-23 NOTE — Patient Instructions (Signed)
Medication Instructions:  The current medical regimen is effective;  continue present plan and medications.  If you need a refill on your cardiac medications before your next appointment, please call your pharmacy.   Follow-Up: At [facility], you and your health needs are our priority.  As part of our continuing mission to provide you with exceptional heart care, we have created designated Provider Care Teams.  These Care Teams include your primary Cardiologist (physician) and Advanced Practice Providers (APPs -  Physician Assistants and Nurse Practitioners) who all work together to provide you with the care you need, when you need it. You will need a follow up appointment in 12 months.  Please call our office 2 months in advance to schedule this appointment.  You may see Dr Mark Skains. or one of the following Advanced Practice Providers on your designated Care Team:   Lori Gerhardt, NP Laura Ingold, NP . Jill McDaniel, NP  Thank you for choosing [facility]!!      

## 2018-02-23 NOTE — Progress Notes (Signed)
Cardiology Office Note:    Date:  02/23/2018   ID:  [name], DOB [birth year], MRN [id number]  PCP:  Josetta Huddle, MD  Cardiologist:  No primary care provider on file.  Electrophysiologist:  None   Referring MD: Josetta Huddle, MD     History of Present Illness:    [name] is a 84 y.o. male here for follow-up of permanent atrial fibrillation.  This was discovered back in [place] after he had a small pericardial effusion with subacute rib fractures.  He was treated with prednisone.  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  On chronic anticoagulation.  Repeat echocardiogram showed resolution of pericardial effusion.  Prior ESR was normal at Dr. Lenna Gilford in working up his effusion.  Enjoys visiting [place], apartment [place] [address].  Wife had breast cancer. Radiation was tough. Infection. [place]. [place].  It has been a tough road.    Past Medical History:  Diagnosis Date  . Allergic rhinitis   . Arthritis   . Atrial fibrillation ([place])   . Coronary artery disease    mild, non-obstructive CAD by 2001 cath  . Hearing decreased   . Hypertension   . Hyponatremia   . Impacted cerumen   . Myalgia   . Pericardial effusion    August 2014-repeat echocardiogram 12/11/12-no effusion  . Pleural effusion    August 2014  . Pure hypercholesterolemia   . PVC (premature ventricular contraction)   . RBBB    with left anterior hemiblock  . Renal insufficiency    stage III, creatinine 1.41, June, 2013 - Celebrex reduced  . Spinal stenosis    with back pain - considering neurosurgery with Dr. Sherley Bounds, March, 2014  . Tachycardia   . Vitamin D deficiency     Past Surgical History:  Procedure Laterality Date  . CARDIAC CATHETERIZATION      normal left main, LAD with 40-50% narrowing proximal LAD, diagonal vessel with 20-30% irregular lesion, left circumflex normal, right coronary artery normal, left ventricular function normal -  Dr. Bea Laura  . CATARACT EXTRACTION    . HERNIA REPAIR    . INNER EAR SURGERY    . JOINT REPLACEMENT Bilateral    hip  . LUMBAR LAMINECTOMY/DECOMPRESSION MICRODISCECTOMY N/A 08/08/2012   Procedure: LUMBAR FOUR TO FIVE, LUMBAR FIVE TO SACRAL ONE LUMBAR DECOMPRESSION;  Surgeon: Eustace Moore, MD;  Location: [facility];  Service: Neurosurgery;  Laterality: N/A;  LUMBAR LAMINECTOMY/DECOMPRESSION MICRODISCECTOMY 2 LEVELS    Current Medications: Current Meds  Medication Sig  . ALPRAZolam (XANAX) 0.25 MG tablet Take 0.25 mg by mouth at bedtime as needed for anxiety.  Marland Kitchen amLODipine (NORVASC) 5 MG tablet Take 5 mg by mouth daily.  . cholecalciferol (VITAMIN D) 1000 UNITS tablet Take 1,000 Units by mouth daily.  . finasteride (PROSCAR) 5 MG tablet Take 5 mg by mouth at bedtime.  Marland Kitchen losartan-hydrochlorothiazide (HYZAAR) 50-12.5 MG tablet Take 1 tablet by mouth daily.  . metoprolol succinate (TOPROL-XL) 50 MG 24 hr tablet Take 50 mg by mouth daily. Take with or immediately following a meal.  . Multiple Vitamins-Minerals (MULTIVITAMIN WITH MINERALS) tablet Take 1 tablet by mouth daily.  Marland Kitchen omeprazole (PRILOSEC) 20 MG capsule Take 20 mg by mouth daily.  . probenecid (BENEMID) 500 MG tablet Take 250 mg by mouth daily.   . Rivaroxaban (XARELTO) 15 MG TABS tablet Take 1 tablet (15 mg total) by mouth daily with supper.  Marland Kitchen  rosuvastatin (CRESTOR) 5 MG tablet Take 5 mg by mouth at bedtime.  Marland Kitchen terazosin (HYTRIN) 10 MG capsule Take 10 mg by mouth at bedtime.  . TRAMADOL HCL ER PO Take by mouth as needed.  . zolpidem (AMBIEN) 5 MG tablet Take 5 mg by mouth at bedtime as needed for sleep.     Allergies:   Patient has no known allergies.   Social History   Socioeconomic History  . Marital status: Married    Spouse name: Not on file  . Number of children: Not on file  . Years of education: Not on file  . Highest education level: Not on file  Occupational History  . Not on file  Social Needs  .  Financial resource strain: Not on file  . Food insecurity:    Worry: Not on file    Inability: Not on file  . Transportation needs:    Medical: Not on file    Non-medical: Not on file  Tobacco Use  . Smoking status: Never Smoker  . Smokeless tobacco: Never Used  Substance and Sexual Activity  . Alcohol use: Yes    Alcohol/week: 1.0 standard drinks    Types: 1 Standard drinks or equivalent per week  . Drug use: No  . Sexual activity: Not on file  Lifestyle  . Physical activity:    Days per week: Not on file    Minutes per session: Not on file  . Stress: Not on file  Relationships  . Social connections:    Talks on phone: Not on file    Gets together: Not on file    Attends religious service: Not on file    Active member of club or organization: Not on file    Attends meetings of clubs or organizations: Not on file    Relationship status: Not on file  Other Topics Concern  . Not on file  Social History Narrative  . Not on file     Family History: The patient's family history includes CAD in his father and mother; Prostate cancer in his father.  ROS:   Please see the history of present illness.    Denies any fevers chills nausea vomiting syncope bleeding.  All other systems reviewed and are negative.  EKGs/Labs/Other Studies Reviewed:    The following studies were reviewed today: Prior office note lab work EKG  EKG:  EKG is  ordered today.  The ekg ordered today demonstrates 02/23/2018- 67 atrial fibrillation right bundle branch block no other significant changes personally reviewed and interpreted.  Recent Labs: No results found for requested labs within last 8760 hours.  Recent Lipid Panel No results found for: CHOL, TRIG, HDL, CHOLHDL, VLDL, LDLCALC, LDLDIRECT  Physical Exam:    VS:  BP 132/72   Pulse 67   Ht '5\' 9"'$  (1.753 m)   Wt 168 lb (76.2 kg)   BMI 24.81 kg/m     Wt Readings from Last 3 Encounters:  02/23/18 168 lb (76.2 kg)  03/01/17 172 lb 12.8 oz  (78.4 kg)  02/24/16 172 lb (78 kg)     GEN:  Well nourished, well developed in no acute distress HEENT: Normal NECK: No JVD; No carotid bruits LYMPHATICS: No lymphadenopathy CARDIAC: IRRR, no murmurs, rubs, gallops RESPIRATORY:  Clear to auscultation without rales, wheezing or rhonchi  ABDOMEN: Soft, non-tender, non-distended MUSCULOSKELETAL:  No edema; No deformity  SKIN: Warm and dry NEUROLOGIC:  Alert and oriented x 3 PSYCHIATRIC:  Normal affect   ASSESSMENT:  1. Permanent atrial fibrillation   2. Coronary artery disease due to lipid rich plaque   3. Chronic anticoagulation   4. Right bundle branch block    PLAN:    In order of problems listed above:  Permanent atrial fibrillation -Doing well on anticoagulation, no bleeding.  No syncope.  Right bundle branch block noted.  Currently on Toprol 50.  Chronic anticoagulation -Xarelto 15 mg, lower doses because of creatinine clearance below 50.  No bleeding.  Essential hypertension -Occasionally elevated at office visits, continue to monitor.  Dr. Inda Merlin has been watching as well.  Right bundle branch block stable no changes.  History of pericardial effusion and pleural effusion, resolved.  Inflammatory.  This was several years ago.  Coronary artery disease nonobstructive - Cardiac catheterization took place in 2001, stress echocardiogram on 08/31/2015 showed no evidence of ischemia.  Occasionally he may have some rare shoulder pain when swimming but no ischemic symptoms.   Medication Adjustments/Labs and Tests Ordered: Current medicines are reviewed at length with the patient today.  Concerns regarding medicines are outlined above.  Orders Placed This Encounter  Procedures  . EKG 12-Lead   No orders of the defined types were placed in this encounter.   Patient Instructions  Medication Instructions:  The current medical regimen is effective;  continue present plan and medications.  If you need a refill on your  cardiac medications before your next appointment, please call your pharmacy.   Follow-Up: At [facility], you and your health needs are our priority.  As part of our continuing mission to provide you with exceptional heart care, we have created designated Provider Care Teams.  These Care Teams include your primary Cardiologist (physician) and Advanced Practice Providers (APPs -  Physician Assistants and Nurse Practitioners) who all work together to provide you with the care you need, when you need it. You will need a follow up appointment in 12 months.  Please call our office 2 months in advance to schedule this appointment.  You may see Dr Candee Furbish or one of the following Advanced Practice Providers on your designated Care Team:   Truitt Merle, NP Cecilie Kicks, NP . Kathyrn Drown, NP  Thank you for choosing [facility]!!          Signed, Candee Furbish, MD  02/23/2018 10:14 AM    [place]

## 2018-02-27 DIAGNOSIS — H6983 Other specified disorders of Eustachian tube, bilateral: Secondary | ICD-10-CM | POA: Diagnosis not present

## 2018-02-27 DIAGNOSIS — H6521 Chronic serous otitis media, right ear: Secondary | ICD-10-CM | POA: Diagnosis not present

## 2018-02-27 DIAGNOSIS — H6041 Cholesteatoma of right external ear: Secondary | ICD-10-CM | POA: Diagnosis not present

## 2018-02-27 DIAGNOSIS — H906 Mixed conductive and sensorineural hearing loss, bilateral: Secondary | ICD-10-CM | POA: Diagnosis not present

## 2018-02-27 DIAGNOSIS — H7411 Adhesive right middle ear disease: Secondary | ICD-10-CM | POA: Diagnosis not present

## 2018-02-27 DIAGNOSIS — H7311 Chronic myringitis, right ear: Secondary | ICD-10-CM | POA: Diagnosis not present

## 2018-03-02 DIAGNOSIS — L570 Actinic keratosis: Secondary | ICD-10-CM | POA: Diagnosis not present

## 2018-03-02 DIAGNOSIS — L905 Scar conditions and fibrosis of skin: Secondary | ICD-10-CM | POA: Diagnosis not present

## 2018-03-06 DIAGNOSIS — L570 Actinic keratosis: Secondary | ICD-10-CM | POA: Diagnosis not present

## 2018-03-06 DIAGNOSIS — L853 Xerosis cutis: Secondary | ICD-10-CM | POA: Diagnosis not present

## 2018-03-06 DIAGNOSIS — L578 Other skin changes due to chronic exposure to nonionizing radiation: Secondary | ICD-10-CM | POA: Diagnosis not present

## 2018-03-19 DIAGNOSIS — J069 Acute upper respiratory infection, unspecified: Secondary | ICD-10-CM | POA: Diagnosis not present

## 2018-04-30 DIAGNOSIS — L578 Other skin changes due to chronic exposure to nonionizing radiation: Secondary | ICD-10-CM | POA: Diagnosis not present

## 2018-04-30 DIAGNOSIS — R234 Changes in skin texture: Secondary | ICD-10-CM | POA: Diagnosis not present

## 2018-04-30 DIAGNOSIS — I10 Essential (primary) hypertension: Secondary | ICD-10-CM | POA: Diagnosis not present

## 2018-04-30 DIAGNOSIS — D485 Neoplasm of uncertain behavior of skin: Secondary | ICD-10-CM | POA: Diagnosis not present

## 2018-04-30 DIAGNOSIS — L570 Actinic keratosis: Secondary | ICD-10-CM | POA: Diagnosis not present

## 2018-04-30 DIAGNOSIS — I4891 Unspecified atrial fibrillation: Secondary | ICD-10-CM | POA: Diagnosis not present

## 2018-05-14 DIAGNOSIS — H7311 Chronic myringitis, right ear: Secondary | ICD-10-CM | POA: Diagnosis not present

## 2018-05-14 DIAGNOSIS — H6041 Cholesteatoma of right external ear: Secondary | ICD-10-CM | POA: Diagnosis not present

## 2018-05-14 DIAGNOSIS — H6983 Other specified disorders of Eustachian tube, bilateral: Secondary | ICD-10-CM | POA: Diagnosis not present

## 2018-05-14 DIAGNOSIS — H6521 Chronic serous otitis media, right ear: Secondary | ICD-10-CM | POA: Diagnosis not present

## 2018-05-14 DIAGNOSIS — H906 Mixed conductive and sensorineural hearing loss, bilateral: Secondary | ICD-10-CM | POA: Diagnosis not present

## 2018-05-14 DIAGNOSIS — H7411 Adhesive right middle ear disease: Secondary | ICD-10-CM | POA: Diagnosis not present

## 2018-05-14 DIAGNOSIS — H60391 Other infective otitis externa, right ear: Secondary | ICD-10-CM | POA: Diagnosis not present

## 2018-05-22 DIAGNOSIS — H43393 Other vitreous opacities, bilateral: Secondary | ICD-10-CM | POA: Diagnosis not present

## 2018-05-22 DIAGNOSIS — H35363 Drusen (degenerative) of macula, bilateral: Secondary | ICD-10-CM | POA: Diagnosis not present

## 2018-05-22 DIAGNOSIS — H52213 Irregular astigmatism, bilateral: Secondary | ICD-10-CM | POA: Diagnosis not present

## 2018-05-22 DIAGNOSIS — Z961 Presence of intraocular lens: Secondary | ICD-10-CM | POA: Diagnosis not present

## 2018-05-22 DIAGNOSIS — H26491 Other secondary cataract, right eye: Secondary | ICD-10-CM | POA: Diagnosis not present

## 2018-05-22 DIAGNOSIS — H04123 Dry eye syndrome of bilateral lacrimal glands: Secondary | ICD-10-CM | POA: Diagnosis not present

## 2018-06-14 DIAGNOSIS — Z7189 Other specified counseling: Secondary | ICD-10-CM | POA: Diagnosis not present

## 2018-06-14 DIAGNOSIS — Z03818 Encounter for observation for suspected exposure to other biological agents ruled out: Secondary | ICD-10-CM | POA: Diagnosis not present

## 2018-07-03 DIAGNOSIS — M545 Low back pain: Secondary | ICD-10-CM | POA: Diagnosis not present

## 2018-07-03 DIAGNOSIS — E782 Mixed hyperlipidemia: Secondary | ICD-10-CM | POA: Diagnosis not present

## 2018-07-03 DIAGNOSIS — I4891 Unspecified atrial fibrillation: Secondary | ICD-10-CM | POA: Diagnosis not present

## 2018-07-03 DIAGNOSIS — G4700 Insomnia, unspecified: Secondary | ICD-10-CM | POA: Diagnosis not present

## 2018-07-03 DIAGNOSIS — N183 Chronic kidney disease, stage 3 (moderate): Secondary | ICD-10-CM | POA: Diagnosis not present

## 2018-07-03 DIAGNOSIS — F418 Other specified anxiety disorders: Secondary | ICD-10-CM | POA: Diagnosis not present

## 2018-07-25 DIAGNOSIS — I482 Chronic atrial fibrillation, unspecified: Secondary | ICD-10-CM | POA: Diagnosis not present

## 2018-07-25 DIAGNOSIS — N183 Chronic kidney disease, stage 3 (moderate): Secondary | ICD-10-CM | POA: Diagnosis not present

## 2018-07-25 DIAGNOSIS — I10 Essential (primary) hypertension: Secondary | ICD-10-CM | POA: Diagnosis not present

## 2018-07-25 DIAGNOSIS — N401 Enlarged prostate with lower urinary tract symptoms: Secondary | ICD-10-CM | POA: Diagnosis not present

## 2018-07-25 DIAGNOSIS — E782 Mixed hyperlipidemia: Secondary | ICD-10-CM | POA: Diagnosis not present

## 2018-07-25 DIAGNOSIS — E7800 Pure hypercholesterolemia, unspecified: Secondary | ICD-10-CM | POA: Diagnosis not present

## 2018-07-25 DIAGNOSIS — N400 Enlarged prostate without lower urinary tract symptoms: Secondary | ICD-10-CM | POA: Diagnosis not present

## 2018-07-25 DIAGNOSIS — I2510 Atherosclerotic heart disease of native coronary artery without angina pectoris: Secondary | ICD-10-CM | POA: Diagnosis not present

## 2018-07-25 DIAGNOSIS — M169 Osteoarthritis of hip, unspecified: Secondary | ICD-10-CM | POA: Diagnosis not present

## 2018-07-25 DIAGNOSIS — I4891 Unspecified atrial fibrillation: Secondary | ICD-10-CM | POA: Diagnosis not present

## 2018-08-29 DIAGNOSIS — L989 Disorder of the skin and subcutaneous tissue, unspecified: Secondary | ICD-10-CM | POA: Diagnosis not present

## 2018-08-31 DIAGNOSIS — D485 Neoplasm of uncertain behavior of skin: Secondary | ICD-10-CM | POA: Diagnosis not present

## 2018-08-31 DIAGNOSIS — L578 Other skin changes due to chronic exposure to nonionizing radiation: Secondary | ICD-10-CM | POA: Diagnosis not present

## 2018-08-31 DIAGNOSIS — L570 Actinic keratosis: Secondary | ICD-10-CM | POA: Diagnosis not present

## 2018-08-31 DIAGNOSIS — C44219 Basal cell carcinoma of skin of left ear and external auricular canal: Secondary | ICD-10-CM | POA: Diagnosis not present

## 2018-08-31 DIAGNOSIS — T148XXA Other injury of unspecified body region, initial encounter: Secondary | ICD-10-CM | POA: Diagnosis not present

## 2018-09-10 DIAGNOSIS — C44212 Basal cell carcinoma of skin of right ear and external auricular canal: Secondary | ICD-10-CM | POA: Diagnosis not present

## 2018-09-13 DIAGNOSIS — C44219 Basal cell carcinoma of skin of left ear and external auricular canal: Secondary | ICD-10-CM | POA: Diagnosis not present

## 2018-10-01 DIAGNOSIS — D510 Vitamin B12 deficiency anemia due to intrinsic factor deficiency: Secondary | ICD-10-CM | POA: Diagnosis not present

## 2018-10-01 DIAGNOSIS — N183 Chronic kidney disease, stage 3 (moderate): Secondary | ICD-10-CM | POA: Diagnosis not present

## 2018-10-01 DIAGNOSIS — M109 Gout, unspecified: Secondary | ICD-10-CM | POA: Diagnosis not present

## 2018-10-01 DIAGNOSIS — I509 Heart failure, unspecified: Secondary | ICD-10-CM | POA: Diagnosis not present

## 2018-10-01 DIAGNOSIS — R350 Frequency of micturition: Secondary | ICD-10-CM | POA: Diagnosis not present

## 2018-10-01 DIAGNOSIS — Z79899 Other long term (current) drug therapy: Secondary | ICD-10-CM | POA: Diagnosis not present

## 2018-10-01 DIAGNOSIS — J069 Acute upper respiratory infection, unspecified: Secondary | ICD-10-CM | POA: Diagnosis not present

## 2018-10-01 DIAGNOSIS — E539 Vitamin B deficiency, unspecified: Secondary | ICD-10-CM | POA: Diagnosis not present

## 2018-10-01 DIAGNOSIS — N139 Obstructive and reflux uropathy, unspecified: Secondary | ICD-10-CM | POA: Diagnosis not present

## 2018-10-01 DIAGNOSIS — N400 Enlarged prostate without lower urinary tract symptoms: Secondary | ICD-10-CM | POA: Diagnosis not present

## 2018-10-01 DIAGNOSIS — D519 Vitamin B12 deficiency anemia, unspecified: Secondary | ICD-10-CM | POA: Diagnosis not present

## 2018-10-01 DIAGNOSIS — D7589 Other specified diseases of blood and blood-forming organs: Secondary | ICD-10-CM | POA: Diagnosis not present

## 2018-10-03 DIAGNOSIS — L97529 Non-pressure chronic ulcer of other part of left foot with unspecified severity: Secondary | ICD-10-CM | POA: Diagnosis not present

## 2018-10-03 DIAGNOSIS — I7091 Generalized atherosclerosis: Secondary | ICD-10-CM | POA: Diagnosis not present

## 2018-10-03 DIAGNOSIS — B351 Tinea unguium: Secondary | ICD-10-CM | POA: Diagnosis not present

## 2018-10-09 DIAGNOSIS — N281 Cyst of kidney, acquired: Secondary | ICD-10-CM | POA: Diagnosis not present

## 2018-10-09 DIAGNOSIS — N183 Chronic kidney disease, stage 3 (moderate): Secondary | ICD-10-CM | POA: Diagnosis not present

## 2018-10-11 DIAGNOSIS — L0390 Cellulitis, unspecified: Secondary | ICD-10-CM | POA: Diagnosis not present

## 2018-10-15 DIAGNOSIS — I10 Essential (primary) hypertension: Secondary | ICD-10-CM | POA: Diagnosis not present

## 2018-10-15 DIAGNOSIS — R609 Edema, unspecified: Secondary | ICD-10-CM | POA: Diagnosis not present

## 2018-10-15 DIAGNOSIS — N183 Chronic kidney disease, stage 3 (moderate): Secondary | ICD-10-CM | POA: Diagnosis not present

## 2018-10-15 DIAGNOSIS — M1000 Idiopathic gout, unspecified site: Secondary | ICD-10-CM | POA: Diagnosis not present

## 2018-10-15 DIAGNOSIS — E790 Hyperuricemia without signs of inflammatory arthritis and tophaceous disease: Secondary | ICD-10-CM | POA: Diagnosis not present

## 2018-10-15 DIAGNOSIS — I509 Heart failure, unspecified: Secondary | ICD-10-CM | POA: Diagnosis not present

## 2018-10-16 DIAGNOSIS — M545 Low back pain: Secondary | ICD-10-CM | POA: Diagnosis not present

## 2018-10-16 DIAGNOSIS — M6281 Muscle weakness (generalized): Secondary | ICD-10-CM | POA: Diagnosis not present

## 2018-10-16 DIAGNOSIS — M25559 Pain in unspecified hip: Secondary | ICD-10-CM | POA: Diagnosis not present

## 2018-10-17 DIAGNOSIS — I7091 Generalized atherosclerosis: Secondary | ICD-10-CM | POA: Diagnosis not present

## 2018-10-17 DIAGNOSIS — L97529 Non-pressure chronic ulcer of other part of left foot with unspecified severity: Secondary | ICD-10-CM | POA: Diagnosis not present

## 2018-10-17 DIAGNOSIS — L03031 Cellulitis of right toe: Secondary | ICD-10-CM | POA: Diagnosis not present

## 2018-10-18 DIAGNOSIS — L578 Other skin changes due to chronic exposure to nonionizing radiation: Secondary | ICD-10-CM | POA: Diagnosis not present

## 2018-10-18 DIAGNOSIS — L570 Actinic keratosis: Secondary | ICD-10-CM | POA: Diagnosis not present

## 2018-10-18 DIAGNOSIS — L814 Other melanin hyperpigmentation: Secondary | ICD-10-CM | POA: Diagnosis not present

## 2018-11-14 DIAGNOSIS — Z23 Encounter for immunization: Secondary | ICD-10-CM | POA: Diagnosis not present

## 2018-11-19 DIAGNOSIS — H52213 Irregular astigmatism, bilateral: Secondary | ICD-10-CM | POA: Diagnosis not present

## 2018-11-19 DIAGNOSIS — H35363 Drusen (degenerative) of macula, bilateral: Secondary | ICD-10-CM | POA: Diagnosis not present

## 2018-11-19 DIAGNOSIS — H04123 Dry eye syndrome of bilateral lacrimal glands: Secondary | ICD-10-CM | POA: Diagnosis not present

## 2018-11-20 DIAGNOSIS — H6061 Unspecified chronic otitis externa, right ear: Secondary | ICD-10-CM | POA: Diagnosis not present

## 2018-11-20 DIAGNOSIS — H6123 Impacted cerumen, bilateral: Secondary | ICD-10-CM | POA: Diagnosis not present

## 2018-11-20 DIAGNOSIS — H903 Sensorineural hearing loss, bilateral: Secondary | ICD-10-CM | POA: Diagnosis not present

## 2018-11-20 DIAGNOSIS — Z03818 Encounter for observation for suspected exposure to other biological agents ruled out: Secondary | ICD-10-CM | POA: Diagnosis not present

## 2018-11-28 DIAGNOSIS — M545 Low back pain: Secondary | ICD-10-CM | POA: Diagnosis not present

## 2018-11-28 DIAGNOSIS — M6281 Muscle weakness (generalized): Secondary | ICD-10-CM | POA: Diagnosis not present

## 2018-11-28 DIAGNOSIS — M25559 Pain in unspecified hip: Secondary | ICD-10-CM | POA: Diagnosis not present

## 2018-11-30 DIAGNOSIS — M25559 Pain in unspecified hip: Secondary | ICD-10-CM | POA: Diagnosis not present

## 2018-11-30 DIAGNOSIS — M545 Low back pain: Secondary | ICD-10-CM | POA: Diagnosis not present

## 2018-11-30 DIAGNOSIS — M6281 Muscle weakness (generalized): Secondary | ICD-10-CM | POA: Diagnosis not present

## 2018-12-05 DIAGNOSIS — H903 Sensorineural hearing loss, bilateral: Secondary | ICD-10-CM | POA: Diagnosis not present

## 2018-12-05 DIAGNOSIS — H90A22 Sensorineural hearing loss, unilateral, left ear, with restricted hearing on the contralateral side: Secondary | ICD-10-CM | POA: Diagnosis not present

## 2018-12-05 DIAGNOSIS — Z03818 Encounter for observation for suspected exposure to other biological agents ruled out: Secondary | ICD-10-CM | POA: Diagnosis not present

## 2018-12-05 DIAGNOSIS — H6061 Unspecified chronic otitis externa, right ear: Secondary | ICD-10-CM | POA: Diagnosis not present

## 2018-12-05 DIAGNOSIS — H90A31 Mixed conductive and sensorineural hearing loss, unilateral, right ear with restricted hearing on the contralateral side: Secondary | ICD-10-CM | POA: Diagnosis not present

## 2018-12-05 DIAGNOSIS — H6121 Impacted cerumen, right ear: Secondary | ICD-10-CM | POA: Diagnosis not present

## 2018-12-06 DIAGNOSIS — M6281 Muscle weakness (generalized): Secondary | ICD-10-CM | POA: Diagnosis not present

## 2018-12-06 DIAGNOSIS — M545 Low back pain: Secondary | ICD-10-CM | POA: Diagnosis not present

## 2018-12-06 DIAGNOSIS — M25559 Pain in unspecified hip: Secondary | ICD-10-CM | POA: Diagnosis not present

## 2018-12-07 DIAGNOSIS — H04123 Dry eye syndrome of bilateral lacrimal glands: Secondary | ICD-10-CM | POA: Diagnosis not present

## 2018-12-12 DIAGNOSIS — M6281 Muscle weakness (generalized): Secondary | ICD-10-CM | POA: Diagnosis not present

## 2018-12-12 DIAGNOSIS — M25559 Pain in unspecified hip: Secondary | ICD-10-CM | POA: Diagnosis not present

## 2018-12-12 DIAGNOSIS — M545 Low back pain: Secondary | ICD-10-CM | POA: Diagnosis not present

## 2018-12-21 DIAGNOSIS — N281 Cyst of kidney, acquired: Secondary | ICD-10-CM | POA: Diagnosis not present

## 2018-12-21 DIAGNOSIS — I4891 Unspecified atrial fibrillation: Secondary | ICD-10-CM | POA: Diagnosis not present

## 2018-12-21 DIAGNOSIS — M169 Osteoarthritis of hip, unspecified: Secondary | ICD-10-CM | POA: Diagnosis not present

## 2018-12-21 DIAGNOSIS — I129 Hypertensive chronic kidney disease with stage 1 through stage 4 chronic kidney disease, or unspecified chronic kidney disease: Secondary | ICD-10-CM | POA: Diagnosis not present

## 2018-12-21 DIAGNOSIS — N401 Enlarged prostate with lower urinary tract symptoms: Secondary | ICD-10-CM | POA: Diagnosis not present

## 2018-12-21 DIAGNOSIS — R42 Dizziness and giddiness: Secondary | ICD-10-CM | POA: Diagnosis not present

## 2018-12-21 DIAGNOSIS — I10 Essential (primary) hypertension: Secondary | ICD-10-CM | POA: Diagnosis not present

## 2018-12-26 DIAGNOSIS — E7849 Other hyperlipidemia: Secondary | ICD-10-CM | POA: Diagnosis not present

## 2018-12-26 DIAGNOSIS — I10 Essential (primary) hypertension: Secondary | ICD-10-CM | POA: Diagnosis not present

## 2019-01-03 DIAGNOSIS — M6281 Muscle weakness (generalized): Secondary | ICD-10-CM | POA: Diagnosis not present

## 2019-01-03 DIAGNOSIS — M25559 Pain in unspecified hip: Secondary | ICD-10-CM | POA: Diagnosis not present

## 2019-01-03 DIAGNOSIS — M545 Low back pain: Secondary | ICD-10-CM | POA: Diagnosis not present

## 2019-01-07 DIAGNOSIS — M25559 Pain in unspecified hip: Secondary | ICD-10-CM | POA: Diagnosis not present

## 2019-01-07 DIAGNOSIS — M545 Low back pain: Secondary | ICD-10-CM | POA: Diagnosis not present

## 2019-01-07 DIAGNOSIS — M6281 Muscle weakness (generalized): Secondary | ICD-10-CM | POA: Diagnosis not present

## 2019-01-08 DIAGNOSIS — L570 Actinic keratosis: Secondary | ICD-10-CM | POA: Diagnosis not present

## 2019-01-08 DIAGNOSIS — L905 Scar conditions and fibrosis of skin: Secondary | ICD-10-CM | POA: Diagnosis not present

## 2019-01-08 DIAGNOSIS — L578 Other skin changes due to chronic exposure to nonionizing radiation: Secondary | ICD-10-CM | POA: Diagnosis not present

## 2019-01-09 DIAGNOSIS — N400 Enlarged prostate without lower urinary tract symptoms: Secondary | ICD-10-CM | POA: Diagnosis not present

## 2019-01-09 DIAGNOSIS — N1831 Chronic kidney disease, stage 3a: Secondary | ICD-10-CM | POA: Diagnosis not present

## 2019-01-09 DIAGNOSIS — E782 Mixed hyperlipidemia: Secondary | ICD-10-CM | POA: Diagnosis not present

## 2019-01-09 DIAGNOSIS — I482 Chronic atrial fibrillation, unspecified: Secondary | ICD-10-CM | POA: Diagnosis not present

## 2019-01-09 DIAGNOSIS — M169 Osteoarthritis of hip, unspecified: Secondary | ICD-10-CM | POA: Diagnosis not present

## 2019-01-09 DIAGNOSIS — N401 Enlarged prostate with lower urinary tract symptoms: Secondary | ICD-10-CM | POA: Diagnosis not present

## 2019-01-09 DIAGNOSIS — E7800 Pure hypercholesterolemia, unspecified: Secondary | ICD-10-CM | POA: Diagnosis not present

## 2019-01-09 DIAGNOSIS — I2510 Atherosclerotic heart disease of native coronary artery without angina pectoris: Secondary | ICD-10-CM | POA: Diagnosis not present

## 2019-01-09 DIAGNOSIS — I4891 Unspecified atrial fibrillation: Secondary | ICD-10-CM | POA: Diagnosis not present

## 2019-01-09 DIAGNOSIS — I129 Hypertensive chronic kidney disease with stage 1 through stage 4 chronic kidney disease, or unspecified chronic kidney disease: Secondary | ICD-10-CM | POA: Diagnosis not present

## 2019-01-09 DIAGNOSIS — I10 Essential (primary) hypertension: Secondary | ICD-10-CM | POA: Diagnosis not present

## 2019-01-10 DIAGNOSIS — M25559 Pain in unspecified hip: Secondary | ICD-10-CM | POA: Diagnosis not present

## 2019-01-10 DIAGNOSIS — M6281 Muscle weakness (generalized): Secondary | ICD-10-CM | POA: Diagnosis not present

## 2019-01-10 DIAGNOSIS — M545 Low back pain: Secondary | ICD-10-CM | POA: Diagnosis not present

## 2019-01-17 DIAGNOSIS — M545 Low back pain: Secondary | ICD-10-CM | POA: Diagnosis not present

## 2019-01-17 DIAGNOSIS — M6281 Muscle weakness (generalized): Secondary | ICD-10-CM | POA: Diagnosis not present

## 2019-01-17 DIAGNOSIS — M25559 Pain in unspecified hip: Secondary | ICD-10-CM | POA: Diagnosis not present

## 2019-01-22 DIAGNOSIS — M25559 Pain in unspecified hip: Secondary | ICD-10-CM | POA: Diagnosis not present

## 2019-01-22 DIAGNOSIS — M545 Low back pain: Secondary | ICD-10-CM | POA: Diagnosis not present

## 2019-01-22 DIAGNOSIS — M6281 Muscle weakness (generalized): Secondary | ICD-10-CM | POA: Diagnosis not present

## 2019-01-23 DIAGNOSIS — Z96642 Presence of left artificial hip joint: Secondary | ICD-10-CM | POA: Diagnosis not present

## 2019-01-23 DIAGNOSIS — M7062 Trochanteric bursitis, left hip: Secondary | ICD-10-CM | POA: Diagnosis not present

## 2019-01-23 DIAGNOSIS — M47816 Spondylosis without myelopathy or radiculopathy, lumbar region: Secondary | ICD-10-CM | POA: Diagnosis not present

## 2019-01-23 DIAGNOSIS — M48061 Spinal stenosis, lumbar region without neurogenic claudication: Secondary | ICD-10-CM | POA: Diagnosis not present

## 2019-01-23 DIAGNOSIS — Z96641 Presence of right artificial hip joint: Secondary | ICD-10-CM | POA: Diagnosis not present

## 2019-01-23 DIAGNOSIS — M7061 Trochanteric bursitis, right hip: Secondary | ICD-10-CM | POA: Diagnosis not present

## 2019-01-24 DIAGNOSIS — R0600 Dyspnea, unspecified: Secondary | ICD-10-CM | POA: Diagnosis not present

## 2019-01-30 DIAGNOSIS — M6281 Muscle weakness (generalized): Secondary | ICD-10-CM | POA: Diagnosis not present

## 2019-01-30 DIAGNOSIS — M25559 Pain in unspecified hip: Secondary | ICD-10-CM | POA: Diagnosis not present

## 2019-01-30 DIAGNOSIS — M545 Low back pain: Secondary | ICD-10-CM | POA: Diagnosis not present

## 2019-02-06 DIAGNOSIS — M25559 Pain in unspecified hip: Secondary | ICD-10-CM | POA: Diagnosis not present

## 2019-02-06 DIAGNOSIS — M545 Low back pain: Secondary | ICD-10-CM | POA: Diagnosis not present

## 2019-02-06 DIAGNOSIS — H903 Sensorineural hearing loss, bilateral: Secondary | ICD-10-CM | POA: Diagnosis not present

## 2019-02-06 DIAGNOSIS — H6123 Impacted cerumen, bilateral: Secondary | ICD-10-CM | POA: Diagnosis not present

## 2019-02-06 DIAGNOSIS — M6281 Muscle weakness (generalized): Secondary | ICD-10-CM | POA: Diagnosis not present

## 2019-02-06 DIAGNOSIS — Z03818 Encounter for observation for suspected exposure to other biological agents ruled out: Secondary | ICD-10-CM | POA: Diagnosis not present

## 2019-02-13 DIAGNOSIS — M6281 Muscle weakness (generalized): Secondary | ICD-10-CM | POA: Diagnosis not present

## 2019-02-13 DIAGNOSIS — M545 Low back pain: Secondary | ICD-10-CM | POA: Diagnosis not present

## 2019-02-13 DIAGNOSIS — M25559 Pain in unspecified hip: Secondary | ICD-10-CM | POA: Diagnosis not present

## 2019-02-18 DIAGNOSIS — Z03818 Encounter for observation for suspected exposure to other biological agents ruled out: Secondary | ICD-10-CM | POA: Diagnosis not present

## 2019-02-18 DIAGNOSIS — Z20828 Contact with and (suspected) exposure to other viral communicable diseases: Secondary | ICD-10-CM | POA: Diagnosis not present

## 2019-03-01 DIAGNOSIS — M7061 Trochanteric bursitis, right hip: Secondary | ICD-10-CM | POA: Diagnosis not present

## 2019-03-01 DIAGNOSIS — M7062 Trochanteric bursitis, left hip: Secondary | ICD-10-CM | POA: Diagnosis not present

## 2019-03-01 DIAGNOSIS — Z96643 Presence of artificial hip joint, bilateral: Secondary | ICD-10-CM | POA: Diagnosis not present

## 2019-03-28 DIAGNOSIS — R0600 Dyspnea, unspecified: Secondary | ICD-10-CM | POA: Diagnosis not present

## 2019-04-03 DIAGNOSIS — Z03818 Encounter for observation for suspected exposure to other biological agents ruled out: Secondary | ICD-10-CM | POA: Diagnosis not present

## 2019-04-03 DIAGNOSIS — H6121 Impacted cerumen, right ear: Secondary | ICD-10-CM | POA: Diagnosis not present

## 2019-04-03 DIAGNOSIS — H6061 Unspecified chronic otitis externa, right ear: Secondary | ICD-10-CM | POA: Diagnosis not present

## 2019-04-03 DIAGNOSIS — H6982 Other specified disorders of Eustachian tube, left ear: Secondary | ICD-10-CM | POA: Diagnosis not present

## 2019-04-05 DIAGNOSIS — M6281 Muscle weakness (generalized): Secondary | ICD-10-CM | POA: Diagnosis not present

## 2019-04-05 DIAGNOSIS — M25559 Pain in unspecified hip: Secondary | ICD-10-CM | POA: Diagnosis not present

## 2019-04-05 DIAGNOSIS — M545 Low back pain: Secondary | ICD-10-CM | POA: Diagnosis not present

## 2019-04-09 DIAGNOSIS — T148XXA Other injury of unspecified body region, initial encounter: Secondary | ICD-10-CM | POA: Diagnosis not present

## 2019-04-09 DIAGNOSIS — L578 Other skin changes due to chronic exposure to nonionizing radiation: Secondary | ICD-10-CM | POA: Diagnosis not present

## 2019-04-09 DIAGNOSIS — L570 Actinic keratosis: Secondary | ICD-10-CM | POA: Diagnosis not present

## 2019-04-12 DIAGNOSIS — M25559 Pain in unspecified hip: Secondary | ICD-10-CM | POA: Diagnosis not present

## 2019-04-12 DIAGNOSIS — M545 Low back pain: Secondary | ICD-10-CM | POA: Diagnosis not present

## 2019-04-12 DIAGNOSIS — M6281 Muscle weakness (generalized): Secondary | ICD-10-CM | POA: Diagnosis not present

## 2019-04-13 DIAGNOSIS — N401 Enlarged prostate with lower urinary tract symptoms: Secondary | ICD-10-CM | POA: Diagnosis not present

## 2019-04-13 DIAGNOSIS — I2510 Atherosclerotic heart disease of native coronary artery without angina pectoris: Secondary | ICD-10-CM | POA: Diagnosis not present

## 2019-04-13 DIAGNOSIS — M169 Osteoarthritis of hip, unspecified: Secondary | ICD-10-CM | POA: Diagnosis not present

## 2019-04-13 DIAGNOSIS — E782 Mixed hyperlipidemia: Secondary | ICD-10-CM | POA: Diagnosis not present

## 2019-04-13 DIAGNOSIS — I4891 Unspecified atrial fibrillation: Secondary | ICD-10-CM | POA: Diagnosis not present

## 2019-04-13 DIAGNOSIS — I482 Chronic atrial fibrillation, unspecified: Secondary | ICD-10-CM | POA: Diagnosis not present

## 2019-04-13 DIAGNOSIS — N400 Enlarged prostate without lower urinary tract symptoms: Secondary | ICD-10-CM | POA: Diagnosis not present

## 2019-04-13 DIAGNOSIS — I129 Hypertensive chronic kidney disease with stage 1 through stage 4 chronic kidney disease, or unspecified chronic kidney disease: Secondary | ICD-10-CM | POA: Diagnosis not present

## 2019-04-13 DIAGNOSIS — I10 Essential (primary) hypertension: Secondary | ICD-10-CM | POA: Diagnosis not present

## 2019-04-17 DIAGNOSIS — M545 Low back pain: Secondary | ICD-10-CM | POA: Diagnosis not present

## 2019-04-17 DIAGNOSIS — M6281 Muscle weakness (generalized): Secondary | ICD-10-CM | POA: Diagnosis not present

## 2019-04-17 DIAGNOSIS — M25559 Pain in unspecified hip: Secondary | ICD-10-CM | POA: Diagnosis not present

## 2019-04-22 DIAGNOSIS — L97529 Non-pressure chronic ulcer of other part of left foot with unspecified severity: Secondary | ICD-10-CM | POA: Diagnosis not present

## 2019-04-25 DIAGNOSIS — I70245 Atherosclerosis of native arteries of left leg with ulceration of other part of foot: Secondary | ICD-10-CM | POA: Diagnosis not present

## 2019-04-25 DIAGNOSIS — L97525 Non-pressure chronic ulcer of other part of left foot with muscle involvement without evidence of necrosis: Secondary | ICD-10-CM | POA: Diagnosis not present

## 2019-04-29 DIAGNOSIS — L97525 Non-pressure chronic ulcer of other part of left foot with muscle involvement without evidence of necrosis: Secondary | ICD-10-CM | POA: Diagnosis not present

## 2019-04-29 DIAGNOSIS — I70245 Atherosclerosis of native arteries of left leg with ulceration of other part of foot: Secondary | ICD-10-CM | POA: Diagnosis not present

## 2019-05-01 DIAGNOSIS — Z20828 Contact with and (suspected) exposure to other viral communicable diseases: Secondary | ICD-10-CM | POA: Diagnosis not present

## 2019-05-01 DIAGNOSIS — I70263 Atherosclerosis of native arteries of extremities with gangrene, bilateral legs: Secondary | ICD-10-CM | POA: Diagnosis not present

## 2019-05-01 DIAGNOSIS — I83893 Varicose veins of bilateral lower extremities with other complications: Secondary | ICD-10-CM | POA: Diagnosis not present

## 2019-05-06 DIAGNOSIS — I129 Hypertensive chronic kidney disease with stage 1 through stage 4 chronic kidney disease, or unspecified chronic kidney disease: Secondary | ICD-10-CM | POA: Diagnosis not present

## 2019-05-06 DIAGNOSIS — I10 Essential (primary) hypertension: Secondary | ICD-10-CM | POA: Diagnosis not present

## 2019-05-06 DIAGNOSIS — M169 Osteoarthritis of hip, unspecified: Secondary | ICD-10-CM | POA: Diagnosis not present

## 2019-05-06 DIAGNOSIS — I2510 Atherosclerotic heart disease of native coronary artery without angina pectoris: Secondary | ICD-10-CM | POA: Diagnosis not present

## 2019-05-06 DIAGNOSIS — I4891 Unspecified atrial fibrillation: Secondary | ICD-10-CM | POA: Diagnosis not present

## 2019-05-06 DIAGNOSIS — N401 Enlarged prostate with lower urinary tract symptoms: Secondary | ICD-10-CM | POA: Diagnosis not present

## 2019-05-06 DIAGNOSIS — E782 Mixed hyperlipidemia: Secondary | ICD-10-CM | POA: Diagnosis not present

## 2019-05-06 DIAGNOSIS — N400 Enlarged prostate without lower urinary tract symptoms: Secondary | ICD-10-CM | POA: Diagnosis not present

## 2019-05-06 DIAGNOSIS — I482 Chronic atrial fibrillation, unspecified: Secondary | ICD-10-CM | POA: Diagnosis not present

## 2019-05-06 DIAGNOSIS — E7800 Pure hypercholesterolemia, unspecified: Secondary | ICD-10-CM | POA: Diagnosis not present

## 2019-05-07 DIAGNOSIS — L97525 Non-pressure chronic ulcer of other part of left foot with muscle involvement without evidence of necrosis: Secondary | ICD-10-CM | POA: Diagnosis not present

## 2019-05-07 DIAGNOSIS — I70245 Atherosclerosis of native arteries of left leg with ulceration of other part of foot: Secondary | ICD-10-CM | POA: Diagnosis not present

## 2019-05-15 DIAGNOSIS — N183 Chronic kidney disease, stage 3 unspecified: Secondary | ICD-10-CM | POA: Diagnosis not present

## 2019-05-15 DIAGNOSIS — M545 Low back pain: Secondary | ICD-10-CM | POA: Diagnosis not present

## 2019-05-29 DIAGNOSIS — Z03818 Encounter for observation for suspected exposure to other biological agents ruled out: Secondary | ICD-10-CM | POA: Diagnosis not present

## 2019-05-29 DIAGNOSIS — H903 Sensorineural hearing loss, bilateral: Secondary | ICD-10-CM | POA: Diagnosis not present

## 2019-05-29 DIAGNOSIS — H6121 Impacted cerumen, right ear: Secondary | ICD-10-CM | POA: Diagnosis not present

## 2019-05-30 DIAGNOSIS — L97525 Non-pressure chronic ulcer of other part of left foot with muscle involvement without evidence of necrosis: Secondary | ICD-10-CM | POA: Diagnosis not present

## 2019-05-30 DIAGNOSIS — I70245 Atherosclerosis of native arteries of left leg with ulceration of other part of foot: Secondary | ICD-10-CM | POA: Diagnosis not present

## 2019-06-04 DIAGNOSIS — M545 Low back pain: Secondary | ICD-10-CM | POA: Diagnosis not present

## 2019-06-04 DIAGNOSIS — M25559 Pain in unspecified hip: Secondary | ICD-10-CM | POA: Diagnosis not present

## 2019-06-04 DIAGNOSIS — M6281 Muscle weakness (generalized): Secondary | ICD-10-CM | POA: Diagnosis not present

## 2019-06-10 DIAGNOSIS — N183 Chronic kidney disease, stage 3 unspecified: Secondary | ICD-10-CM | POA: Diagnosis not present

## 2019-06-10 DIAGNOSIS — I482 Chronic atrial fibrillation, unspecified: Secondary | ICD-10-CM | POA: Diagnosis not present

## 2019-06-10 DIAGNOSIS — E782 Mixed hyperlipidemia: Secondary | ICD-10-CM | POA: Diagnosis not present

## 2019-06-10 DIAGNOSIS — I129 Hypertensive chronic kidney disease with stage 1 through stage 4 chronic kidney disease, or unspecified chronic kidney disease: Secondary | ICD-10-CM | POA: Diagnosis not present

## 2019-06-10 DIAGNOSIS — I2510 Atherosclerotic heart disease of native coronary artery without angina pectoris: Secondary | ICD-10-CM | POA: Diagnosis not present

## 2019-06-10 DIAGNOSIS — E7800 Pure hypercholesterolemia, unspecified: Secondary | ICD-10-CM | POA: Diagnosis not present

## 2019-06-10 DIAGNOSIS — I10 Essential (primary) hypertension: Secondary | ICD-10-CM | POA: Diagnosis not present

## 2019-06-10 DIAGNOSIS — M169 Osteoarthritis of hip, unspecified: Secondary | ICD-10-CM | POA: Diagnosis not present

## 2019-06-10 DIAGNOSIS — N400 Enlarged prostate without lower urinary tract symptoms: Secondary | ICD-10-CM | POA: Diagnosis not present

## 2019-06-10 DIAGNOSIS — I4891 Unspecified atrial fibrillation: Secondary | ICD-10-CM | POA: Diagnosis not present

## 2019-06-10 DIAGNOSIS — N401 Enlarged prostate with lower urinary tract symptoms: Secondary | ICD-10-CM | POA: Diagnosis not present

## 2019-06-14 DIAGNOSIS — L97525 Non-pressure chronic ulcer of other part of left foot with muscle involvement without evidence of necrosis: Secondary | ICD-10-CM | POA: Diagnosis not present

## 2019-06-14 DIAGNOSIS — I70245 Atherosclerosis of native arteries of left leg with ulceration of other part of foot: Secondary | ICD-10-CM | POA: Diagnosis not present

## 2019-06-19 DIAGNOSIS — M6281 Muscle weakness (generalized): Secondary | ICD-10-CM | POA: Diagnosis not present

## 2019-06-19 DIAGNOSIS — M545 Low back pain: Secondary | ICD-10-CM | POA: Diagnosis not present

## 2019-06-19 DIAGNOSIS — M25559 Pain in unspecified hip: Secondary | ICD-10-CM | POA: Diagnosis not present

## 2019-06-27 DIAGNOSIS — I7091 Generalized atherosclerosis: Secondary | ICD-10-CM | POA: Diagnosis not present

## 2019-06-27 DIAGNOSIS — B351 Tinea unguium: Secondary | ICD-10-CM | POA: Diagnosis not present

## 2019-06-27 DIAGNOSIS — L97529 Non-pressure chronic ulcer of other part of left foot with unspecified severity: Secondary | ICD-10-CM | POA: Diagnosis not present

## 2019-07-05 DIAGNOSIS — L97529 Non-pressure chronic ulcer of other part of left foot with unspecified severity: Secondary | ICD-10-CM | POA: Diagnosis not present

## 2019-07-09 DIAGNOSIS — M6281 Muscle weakness (generalized): Secondary | ICD-10-CM | POA: Diagnosis not present

## 2019-07-09 DIAGNOSIS — M25559 Pain in unspecified hip: Secondary | ICD-10-CM | POA: Diagnosis not present

## 2019-07-09 DIAGNOSIS — M545 Low back pain: Secondary | ICD-10-CM | POA: Diagnosis not present

## 2019-07-10 DIAGNOSIS — N400 Enlarged prostate without lower urinary tract symptoms: Secondary | ICD-10-CM | POA: Diagnosis not present

## 2019-07-10 DIAGNOSIS — I2510 Atherosclerotic heart disease of native coronary artery without angina pectoris: Secondary | ICD-10-CM | POA: Diagnosis not present

## 2019-07-10 DIAGNOSIS — I10 Essential (primary) hypertension: Secondary | ICD-10-CM | POA: Diagnosis not present

## 2019-07-10 DIAGNOSIS — I129 Hypertensive chronic kidney disease with stage 1 through stage 4 chronic kidney disease, or unspecified chronic kidney disease: Secondary | ICD-10-CM | POA: Diagnosis not present

## 2019-07-10 DIAGNOSIS — N183 Chronic kidney disease, stage 3 unspecified: Secondary | ICD-10-CM | POA: Diagnosis not present

## 2019-07-10 DIAGNOSIS — E7800 Pure hypercholesterolemia, unspecified: Secondary | ICD-10-CM | POA: Diagnosis not present

## 2019-07-10 DIAGNOSIS — N401 Enlarged prostate with lower urinary tract symptoms: Secondary | ICD-10-CM | POA: Diagnosis not present

## 2019-07-10 DIAGNOSIS — M169 Osteoarthritis of hip, unspecified: Secondary | ICD-10-CM | POA: Diagnosis not present

## 2019-07-10 DIAGNOSIS — E782 Mixed hyperlipidemia: Secondary | ICD-10-CM | POA: Diagnosis not present

## 2019-07-10 DIAGNOSIS — I4891 Unspecified atrial fibrillation: Secondary | ICD-10-CM | POA: Diagnosis not present

## 2019-07-10 DIAGNOSIS — I482 Chronic atrial fibrillation, unspecified: Secondary | ICD-10-CM | POA: Diagnosis not present

## 2019-07-12 DIAGNOSIS — L97529 Non-pressure chronic ulcer of other part of left foot with unspecified severity: Secondary | ICD-10-CM | POA: Diagnosis not present

## 2019-07-25 DIAGNOSIS — R609 Edema, unspecified: Secondary | ICD-10-CM | POA: Diagnosis not present

## 2019-07-26 DIAGNOSIS — L97419 Non-pressure chronic ulcer of right heel and midfoot with unspecified severity: Secondary | ICD-10-CM | POA: Diagnosis not present

## 2019-07-26 DIAGNOSIS — L97529 Non-pressure chronic ulcer of other part of left foot with unspecified severity: Secondary | ICD-10-CM | POA: Diagnosis not present

## 2019-07-31 DIAGNOSIS — M25559 Pain in unspecified hip: Secondary | ICD-10-CM | POA: Diagnosis not present

## 2019-07-31 DIAGNOSIS — M545 Low back pain: Secondary | ICD-10-CM | POA: Diagnosis not present

## 2019-07-31 DIAGNOSIS — M6281 Muscle weakness (generalized): Secondary | ICD-10-CM | POA: Diagnosis not present

## 2019-08-06 DIAGNOSIS — M25559 Pain in unspecified hip: Secondary | ICD-10-CM | POA: Diagnosis not present

## 2019-08-06 DIAGNOSIS — M545 Low back pain: Secondary | ICD-10-CM | POA: Diagnosis not present

## 2019-08-06 DIAGNOSIS — M6281 Muscle weakness (generalized): Secondary | ICD-10-CM | POA: Diagnosis not present

## 2019-08-08 DIAGNOSIS — L97529 Non-pressure chronic ulcer of other part of left foot with unspecified severity: Secondary | ICD-10-CM | POA: Diagnosis not present

## 2019-08-14 DIAGNOSIS — H6061 Unspecified chronic otitis externa, right ear: Secondary | ICD-10-CM | POA: Diagnosis not present

## 2019-08-14 DIAGNOSIS — H6982 Other specified disorders of Eustachian tube, left ear: Secondary | ICD-10-CM | POA: Diagnosis not present

## 2019-08-14 DIAGNOSIS — H903 Sensorineural hearing loss, bilateral: Secondary | ICD-10-CM | POA: Diagnosis not present

## 2019-08-14 DIAGNOSIS — H6123 Impacted cerumen, bilateral: Secondary | ICD-10-CM | POA: Diagnosis not present

## 2019-08-14 DIAGNOSIS — Z03818 Encounter for observation for suspected exposure to other biological agents ruled out: Secondary | ICD-10-CM | POA: Diagnosis not present

## 2019-08-20 DIAGNOSIS — L821 Other seborrheic keratosis: Secondary | ICD-10-CM | POA: Diagnosis not present

## 2019-08-20 DIAGNOSIS — L570 Actinic keratosis: Secondary | ICD-10-CM | POA: Diagnosis not present

## 2019-08-20 DIAGNOSIS — L578 Other skin changes due to chronic exposure to nonionizing radiation: Secondary | ICD-10-CM | POA: Diagnosis not present

## 2019-09-10 DIAGNOSIS — L97529 Non-pressure chronic ulcer of other part of left foot with unspecified severity: Secondary | ICD-10-CM | POA: Diagnosis not present

## 2019-10-23 DIAGNOSIS — L853 Xerosis cutis: Secondary | ICD-10-CM | POA: Diagnosis not present

## 2019-10-23 DIAGNOSIS — L814 Other melanin hyperpigmentation: Secondary | ICD-10-CM | POA: Diagnosis not present

## 2019-10-23 DIAGNOSIS — L578 Other skin changes due to chronic exposure to nonionizing radiation: Secondary | ICD-10-CM | POA: Diagnosis not present

## 2019-10-23 DIAGNOSIS — L570 Actinic keratosis: Secondary | ICD-10-CM | POA: Diagnosis not present

## 2019-11-05 DIAGNOSIS — M6281 Muscle weakness (generalized): Secondary | ICD-10-CM | POA: Diagnosis not present

## 2019-11-05 DIAGNOSIS — M25559 Pain in unspecified hip: Secondary | ICD-10-CM | POA: Diagnosis not present

## 2019-11-05 DIAGNOSIS — M545 Low back pain: Secondary | ICD-10-CM | POA: Diagnosis not present

## 2019-11-07 DIAGNOSIS — N183 Chronic kidney disease, stage 3 unspecified: Secondary | ICD-10-CM | POA: Diagnosis not present

## 2019-11-07 DIAGNOSIS — Z0184 Encounter for antibody response examination: Secondary | ICD-10-CM | POA: Diagnosis not present

## 2019-11-07 DIAGNOSIS — R350 Frequency of micturition: Secondary | ICD-10-CM | POA: Diagnosis not present

## 2019-11-07 DIAGNOSIS — E7800 Pure hypercholesterolemia, unspecified: Secondary | ICD-10-CM | POA: Diagnosis not present

## 2019-11-07 DIAGNOSIS — N400 Enlarged prostate without lower urinary tract symptoms: Secondary | ICD-10-CM | POA: Diagnosis not present

## 2019-11-07 DIAGNOSIS — I4891 Unspecified atrial fibrillation: Secondary | ICD-10-CM | POA: Diagnosis not present

## 2019-11-07 DIAGNOSIS — Z79899 Other long term (current) drug therapy: Secondary | ICD-10-CM | POA: Diagnosis not present

## 2019-11-19 DIAGNOSIS — R319 Hematuria, unspecified: Secondary | ICD-10-CM | POA: Diagnosis not present

## 2019-11-20 DIAGNOSIS — N281 Cyst of kidney, acquired: Secondary | ICD-10-CM | POA: Diagnosis not present

## 2019-12-02 DIAGNOSIS — R600 Localized edema: Secondary | ICD-10-CM | POA: Diagnosis not present

## 2019-12-02 DIAGNOSIS — R300 Dysuria: Secondary | ICD-10-CM | POA: Diagnosis not present

## 2019-12-02 DIAGNOSIS — R0602 Shortness of breath: Secondary | ICD-10-CM | POA: Diagnosis not present

## 2019-12-02 DIAGNOSIS — E7849 Other hyperlipidemia: Secondary | ICD-10-CM | POA: Diagnosis not present

## 2019-12-11 DIAGNOSIS — I4891 Unspecified atrial fibrillation: Secondary | ICD-10-CM | POA: Diagnosis not present

## 2019-12-11 DIAGNOSIS — D6869 Other thrombophilia: Secondary | ICD-10-CM | POA: Diagnosis not present

## 2019-12-11 DIAGNOSIS — I2510 Atherosclerotic heart disease of native coronary artery without angina pectoris: Secondary | ICD-10-CM | POA: Diagnosis not present

## 2019-12-11 DIAGNOSIS — N401 Enlarged prostate with lower urinary tract symptoms: Secondary | ICD-10-CM | POA: Diagnosis not present

## 2019-12-11 DIAGNOSIS — N400 Enlarged prostate without lower urinary tract symptoms: Secondary | ICD-10-CM | POA: Diagnosis not present

## 2019-12-11 DIAGNOSIS — E782 Mixed hyperlipidemia: Secondary | ICD-10-CM | POA: Diagnosis not present

## 2019-12-11 DIAGNOSIS — I129 Hypertensive chronic kidney disease with stage 1 through stage 4 chronic kidney disease, or unspecified chronic kidney disease: Secondary | ICD-10-CM | POA: Diagnosis not present

## 2019-12-11 DIAGNOSIS — M169 Osteoarthritis of hip, unspecified: Secondary | ICD-10-CM | POA: Diagnosis not present

## 2019-12-11 DIAGNOSIS — I10 Essential (primary) hypertension: Secondary | ICD-10-CM | POA: Diagnosis not present

## 2019-12-18 DIAGNOSIS — N401 Enlarged prostate with lower urinary tract symptoms: Secondary | ICD-10-CM | POA: Diagnosis not present

## 2019-12-18 DIAGNOSIS — I482 Chronic atrial fibrillation, unspecified: Secondary | ICD-10-CM | POA: Diagnosis not present

## 2019-12-18 DIAGNOSIS — E782 Mixed hyperlipidemia: Secondary | ICD-10-CM | POA: Diagnosis not present

## 2019-12-18 DIAGNOSIS — E7800 Pure hypercholesterolemia, unspecified: Secondary | ICD-10-CM | POA: Diagnosis not present

## 2019-12-18 DIAGNOSIS — N183 Chronic kidney disease, stage 3 unspecified: Secondary | ICD-10-CM | POA: Diagnosis not present

## 2019-12-18 DIAGNOSIS — I129 Hypertensive chronic kidney disease with stage 1 through stage 4 chronic kidney disease, or unspecified chronic kidney disease: Secondary | ICD-10-CM | POA: Diagnosis not present

## 2019-12-18 DIAGNOSIS — I10 Essential (primary) hypertension: Secondary | ICD-10-CM | POA: Diagnosis not present

## 2019-12-18 DIAGNOSIS — I4891 Unspecified atrial fibrillation: Secondary | ICD-10-CM | POA: Diagnosis not present

## 2019-12-18 DIAGNOSIS — N400 Enlarged prostate without lower urinary tract symptoms: Secondary | ICD-10-CM | POA: Diagnosis not present

## 2019-12-18 DIAGNOSIS — I2510 Atherosclerotic heart disease of native coronary artery without angina pectoris: Secondary | ICD-10-CM | POA: Diagnosis not present

## 2019-12-18 DIAGNOSIS — M169 Osteoarthritis of hip, unspecified: Secondary | ICD-10-CM | POA: Diagnosis not present

## 2019-12-19 DIAGNOSIS — Z23 Encounter for immunization: Secondary | ICD-10-CM | POA: Diagnosis not present

## 2019-12-23 DIAGNOSIS — H26491 Other secondary cataract, right eye: Secondary | ICD-10-CM | POA: Diagnosis not present

## 2019-12-23 DIAGNOSIS — H43393 Other vitreous opacities, bilateral: Secondary | ICD-10-CM | POA: Diagnosis not present

## 2019-12-23 DIAGNOSIS — H35363 Drusen (degenerative) of macula, bilateral: Secondary | ICD-10-CM | POA: Diagnosis not present

## 2019-12-23 DIAGNOSIS — Z961 Presence of intraocular lens: Secondary | ICD-10-CM | POA: Diagnosis not present

## 2019-12-23 DIAGNOSIS — H04123 Dry eye syndrome of bilateral lacrimal glands: Secondary | ICD-10-CM | POA: Diagnosis not present

## 2019-12-25 DIAGNOSIS — L814 Other melanin hyperpigmentation: Secondary | ICD-10-CM | POA: Diagnosis not present

## 2019-12-25 DIAGNOSIS — L578 Other skin changes due to chronic exposure to nonionizing radiation: Secondary | ICD-10-CM | POA: Diagnosis not present

## 2019-12-25 DIAGNOSIS — L570 Actinic keratosis: Secondary | ICD-10-CM | POA: Diagnosis not present

## 2019-12-26 DIAGNOSIS — H6982 Other specified disorders of Eustachian tube, left ear: Secondary | ICD-10-CM | POA: Diagnosis not present

## 2019-12-26 DIAGNOSIS — H6061 Unspecified chronic otitis externa, right ear: Secondary | ICD-10-CM | POA: Diagnosis not present

## 2019-12-26 DIAGNOSIS — H6123 Impacted cerumen, bilateral: Secondary | ICD-10-CM | POA: Diagnosis not present

## 2019-12-26 DIAGNOSIS — H903 Sensorineural hearing loss, bilateral: Secondary | ICD-10-CM | POA: Diagnosis not present

## 2020-01-02 DIAGNOSIS — Z23 Encounter for immunization: Secondary | ICD-10-CM | POA: Diagnosis not present

## 2020-01-21 DIAGNOSIS — M5459 Other low back pain: Secondary | ICD-10-CM | POA: Diagnosis not present

## 2020-01-21 DIAGNOSIS — M25559 Pain in unspecified hip: Secondary | ICD-10-CM | POA: Diagnosis not present

## 2020-01-21 DIAGNOSIS — M6281 Muscle weakness (generalized): Secondary | ICD-10-CM | POA: Diagnosis not present

## 2020-01-23 DIAGNOSIS — B351 Tinea unguium: Secondary | ICD-10-CM | POA: Diagnosis not present

## 2020-01-23 DIAGNOSIS — I7091 Generalized atherosclerosis: Secondary | ICD-10-CM | POA: Diagnosis not present

## 2020-01-23 DIAGNOSIS — L97529 Non-pressure chronic ulcer of other part of left foot with unspecified severity: Secondary | ICD-10-CM | POA: Diagnosis not present

## 2020-01-23 DIAGNOSIS — L84 Corns and callosities: Secondary | ICD-10-CM | POA: Diagnosis not present

## 2020-01-28 DIAGNOSIS — E7800 Pure hypercholesterolemia, unspecified: Secondary | ICD-10-CM | POA: Diagnosis not present

## 2020-01-28 DIAGNOSIS — F418 Other specified anxiety disorders: Secondary | ICD-10-CM | POA: Diagnosis not present

## 2020-01-28 DIAGNOSIS — N401 Enlarged prostate with lower urinary tract symptoms: Secondary | ICD-10-CM | POA: Diagnosis not present

## 2020-01-28 DIAGNOSIS — D6869 Other thrombophilia: Secondary | ICD-10-CM | POA: Diagnosis not present

## 2020-01-28 DIAGNOSIS — M169 Osteoarthritis of hip, unspecified: Secondary | ICD-10-CM | POA: Diagnosis not present

## 2020-01-28 DIAGNOSIS — I10 Essential (primary) hypertension: Secondary | ICD-10-CM | POA: Diagnosis not present

## 2020-01-28 DIAGNOSIS — I129 Hypertensive chronic kidney disease with stage 1 through stage 4 chronic kidney disease, or unspecified chronic kidney disease: Secondary | ICD-10-CM | POA: Diagnosis not present

## 2020-01-28 DIAGNOSIS — E559 Vitamin D deficiency, unspecified: Secondary | ICD-10-CM | POA: Diagnosis not present

## 2020-01-28 DIAGNOSIS — E790 Hyperuricemia without signs of inflammatory arthritis and tophaceous disease: Secondary | ICD-10-CM | POA: Diagnosis not present

## 2020-01-28 DIAGNOSIS — I4891 Unspecified atrial fibrillation: Secondary | ICD-10-CM | POA: Diagnosis not present

## 2020-01-28 DIAGNOSIS — I2510 Atherosclerotic heart disease of native coronary artery without angina pectoris: Secondary | ICD-10-CM | POA: Diagnosis not present

## 2020-01-31 ENCOUNTER — Encounter: Payer: Self-pay | Admitting: Cardiology

## 2020-01-31 ENCOUNTER — Ambulatory Visit (INDEPENDENT_AMBULATORY_CARE_PROVIDER_SITE_OTHER): Payer: Medicare Other | Admitting: Cardiology

## 2020-01-31 ENCOUNTER — Other Ambulatory Visit: Payer: Self-pay

## 2020-01-31 VITALS — BP 140/80 | HR 46 | Ht 69.0 in | Wt 165.0 lb

## 2020-01-31 DIAGNOSIS — I4821 Permanent atrial fibrillation: Secondary | ICD-10-CM | POA: Diagnosis not present

## 2020-01-31 DIAGNOSIS — E7800 Pure hypercholesterolemia, unspecified: Secondary | ICD-10-CM | POA: Diagnosis not present

## 2020-01-31 DIAGNOSIS — I4891 Unspecified atrial fibrillation: Secondary | ICD-10-CM | POA: Diagnosis not present

## 2020-01-31 DIAGNOSIS — I2510 Atherosclerotic heart disease of native coronary artery without angina pectoris: Secondary | ICD-10-CM

## 2020-01-31 DIAGNOSIS — G4700 Insomnia, unspecified: Secondary | ICD-10-CM | POA: Diagnosis not present

## 2020-01-31 DIAGNOSIS — N400 Enlarged prostate without lower urinary tract symptoms: Secondary | ICD-10-CM | POA: Diagnosis not present

## 2020-01-31 DIAGNOSIS — M169 Osteoarthritis of hip, unspecified: Secondary | ICD-10-CM | POA: Diagnosis not present

## 2020-01-31 DIAGNOSIS — I2583 Coronary atherosclerosis due to lipid rich plaque: Secondary | ICD-10-CM | POA: Diagnosis not present

## 2020-01-31 DIAGNOSIS — N183 Chronic kidney disease, stage 3 unspecified: Secondary | ICD-10-CM | POA: Diagnosis not present

## 2020-01-31 DIAGNOSIS — I4510 Unspecified right bundle-branch block: Secondary | ICD-10-CM | POA: Diagnosis not present

## 2020-01-31 DIAGNOSIS — I129 Hypertensive chronic kidney disease with stage 1 through stage 4 chronic kidney disease, or unspecified chronic kidney disease: Secondary | ICD-10-CM | POA: Diagnosis not present

## 2020-01-31 DIAGNOSIS — Z7901 Long term (current) use of anticoagulants: Secondary | ICD-10-CM

## 2020-01-31 DIAGNOSIS — N401 Enlarged prostate with lower urinary tract symptoms: Secondary | ICD-10-CM | POA: Diagnosis not present

## 2020-01-31 DIAGNOSIS — I10 Essential (primary) hypertension: Secondary | ICD-10-CM | POA: Diagnosis not present

## 2020-01-31 DIAGNOSIS — E782 Mixed hyperlipidemia: Secondary | ICD-10-CM | POA: Diagnosis not present

## 2020-01-31 MED ORDER — METOPROLOL SUCCINATE ER 25 MG PO TB24: 25.0000 mg | ORAL_TABLET | Freq: Every day | ORAL | 3 refills | Status: DC

## 2020-01-31 NOTE — Progress Notes (Signed)
Cardiology Office Note:    Date:  01/31/2020   ID:  [name], DOB 17-Aug-1926, MRN [id number]  PCP:  Josetta Huddle, MD  [facility] Cardiologist:  Candee Furbish, MD  [facility] Electrophysiologist:  None   Referring MD: Josetta Huddle, MD    History of Present Illness:    [name] is a 86 y.o. male here for the follow-up of permanent atrial fibrillation.  This was discovered a few years ago in [place] after he had a small pericardial effusion with subacute rib fractures.  He was treated with prednisone.  Has been on chronic anticoagulation.  A repeat echocardiogram showed resolution of pericardial effusion (ESR was normal).  Has [address] in [place] [address].    Past Medical History:  Diagnosis Date   Allergic rhinitis    Arthritis    Atrial fibrillation ([place])    Coronary artery disease    mild, non-obstructive CAD by 2001 cath   Hearing decreased    Hypertension    Hyponatremia    Impacted cerumen    Myalgia    Pericardial effusion    August 2014-repeat echocardiogram 12/11/12-no effusion   Pleural effusion    August 2014   Pure hypercholesterolemia    PVC (premature ventricular contraction)    RBBB    with left anterior hemiblock   Renal insufficiency    stage III, creatinine 1.41, June, 2013 - Celebrex reduced   Spinal stenosis    with back pain - considering neurosurgery with Dr. Sherley Bounds, March, 2014   Tachycardia    Vitamin D deficiency     Past Surgical History:  Procedure Laterality Date   CARDIAC CATHETERIZATION      normal left main, LAD with 40-50% narrowing proximal LAD, diagonal vessel with 20-30% irregular lesion, left circumflex normal, right coronary artery normal, left ventricular function normal - Dr. Bea Laura   CATARACT EXTRACTION     HERNIA REPAIR     INNER EAR SURGERY     JOINT REPLACEMENT Bilateral    hip   LUMBAR LAMINECTOMY/DECOMPRESSION  MICRODISCECTOMY N/A 08/08/2012   Procedure: LUMBAR FOUR TO FIVE, LUMBAR FIVE TO SACRAL ONE LUMBAR DECOMPRESSION;  Surgeon: Eustace Moore, MD;  Location: [place] [facility];  Service: Neurosurgery;  Laterality: N/A;  LUMBAR LAMINECTOMY/DECOMPRESSION MICRODISCECTOMY 2 LEVELS    Current Medications: Current Meds  Medication Sig   ALPRAZolam (XANAX) 0.25 MG tablet Take 0.25 mg by mouth at bedtime as needed for anxiety.   amLODipine (NORVASC) 5 MG tablet Take 5 mg by mouth daily.   cholecalciferol (VITAMIN D) 1000 UNITS tablet Take 1,000 Units by mouth daily.   finasteride (PROSCAR) 5 MG tablet Take 5 mg by mouth at bedtime.   furosemide (LASIX) 20 MG tablet Take 20 mg by mouth as needed.   hydrochlorothiazide (MICROZIDE) 12.5 MG capsule Take 12.5 mg by mouth daily.   losartan (COZAAR) 50 MG tablet Take 50 mg by mouth daily.   Multiple Vitamins-Minerals (MULTIVITAMIN WITH MINERALS) tablet Take 1 tablet by mouth daily.   omeprazole (PRILOSEC) 20 MG capsule Take 20 mg by mouth daily.   probenecid (BENEMID) 500 MG tablet Take 250 mg by mouth daily.    Rivaroxaban (XARELTO) 15 MG TABS tablet Take 1 tablet (15 mg total) by mouth daily with supper.   rosuvastatin (CRESTOR) 5 MG tablet Take 5 mg by mouth at bedtime.   terazosin (HYTRIN) 10 MG capsule Take 10 mg by mouth at bedtime.  TRAMADOL HCL ER PO Take by mouth as needed.   zolpidem (AMBIEN) 5 MG tablet Take 5 mg by mouth at bedtime as needed for sleep.   [DISCONTINUED] metoprolol succinate (TOPROL-XL) 50 MG 24 hr tablet Take 50 mg by mouth daily. Take with or immediately following a meal.     Allergies:   Patient has no known allergies.   Social History   Socioeconomic History   Marital status: Married    Spouse name: Not on file   Number of children: Not on file   Years of education: Not on file   Highest education level: Not on file  Occupational History   Not on file  Tobacco Use   Smoking status: Never Smoker    Smokeless tobacco: Never Used  Substance and Sexual Activity   Alcohol use: Yes    Alcohol/week: 1.0 standard drink    Types: 1 Standard drinks or equivalent per week   Drug use: No   Sexual activity: Not on file  Other Topics Concern   Not on file  Social History Narrative   Not on file   Social Determinants of Health   Financial Resource Strain:    Difficulty of Paying Living Expenses: Not on file  Food Insecurity:    Worried About [place] in the Last Year: Not on file   Ran Out of Food in the Last Year: Not on file  Transportation Needs:    Lack of Transportation (Medical): Not on file   Lack of Transportation (Non-Medical): Not on file  Physical Activity:    Days of Exercise per Week: Not on file   Minutes of Exercise per Session: Not on file  Stress:    Feeling of Stress : Not on file  Social Connections:    Frequency of Communication with Friends and Family: Not on file   Frequency of Social Gatherings with Friends and Family: Not on file   Attends Religious Services: Not on file   Active Member of Clubs or Organizations: Not on file   Attends Archivist Meetings: Not on file   Marital Status: Not on file     Family History: The patient's family history includes CAD in his father and mother; Prostate cancer in his father.  ROS:   Please see the history of present illness.    No bleeding no fevers no chills no nausea. All other systems reviewed and are negative.  EKGs/Labs/Other Studies Reviewed:     EKG:  EKG is  ordered today.  The ekg ordered today demonstrates 46 A. fib right bundle branch block left anterior fascicular block  Recent Labs: No results found for requested labs within last 8760 hours.  Recent Lipid Panel No results found for: CHOL, TRIG, HDL, CHOLHDL, VLDL, LDLCALC, LDLDIRECT   Physical Exam:    VS:  BP 140/80    Pulse (!) 46    Ht _0  (1.753 m)    Wt 165 lb (74.8 kg)    BMI 24.37 kg/m     Wt  Readings from Last 3 Encounters:  01/31/20 165 lb (74.8 kg)  02/23/18 168 lb (76.2 kg)  03/01/17 172 lb 12.8 oz (78.4 kg)     GEN:  Well nourished, well developed in no acute distress, cane use HEENT: Normal NECK: No JVD; No carotid bruits LYMPHATICS: No lymphadenopathy CARDIAC: irreg brady no murmurs, rubs, gallops RESPIRATORY:  Clear to auscultation without rales, wheezing or rhonchi  ABDOMEN: Soft, non-tender, non-distended MUSCULOSKELETAL:  No  edema; No deformity  SKIN: Warm and dry NEUROLOGIC:  Alert and oriented x 3 PSYCHIATRIC:  Normal affect   ASSESSMENT:    1. Permanent atrial fibrillation ([place])   2. Coronary artery disease due to lipid rich plaque   3. Chronic anticoagulation   4. Right bundle branch block    PLAN:    In order of problems listed above:  Permanent atrial fibrillation -Doing well on anticoagulation, no syncope no bleeding right bundle branch block noted.  On Toprol 50 mg a day. Heart rate is too slow, 46 bpm. Right bundle branch block left anterior fascicular block. We are going to decrease the Toprol to 25.  Chronic anticoagulation -On lower dose Xarelto because of creatinine clearance below 50.  No bleeding  Essential hypertension -Has been occasionally elevated at office visits. He was having some difficulty with the every other day dosing of losartan. Blood pressure today here in clinic 140/80. Lets go ahead and continue with HCTZ 12.5 and losartan 50 mg daily. -I am decreasing his Toprol down to 25 mg a day based upon his bradycardia right bundle branch block and left anterior fascicular block. He understands that a pacemaker may be warranted at some point. We will see him back in 2 months. Check EKG at that time. We may need to eliminate the Toprol.  Coronary artery disease -Nonobstructive disease.  Prior cardiac catheterization took place 2001.  Stress echocardiogram in 2017 showed no evidence of ischemia.  Previously may have had some rare  shoulder pain with swimming but no ischemic symptoms    Shared Decision Making/Informed Consent      Medication Adjustments/Labs and Tests Ordered: Current medicines are reviewed at length with the patient today.  Concerns regarding medicines are outlined above.  Orders Placed This Encounter  Procedures   EKG 12-Lead   Meds ordered this encounter  Medications   metoprolol succinate (TOPROL-XL) 25 MG 24 hr tablet    Sig: Take 1 tablet (25 mg total) by mouth daily.    Dispense:  90 tablet    Refill:  3    Pt will call when ready to fill.  Please d/c any other RX for Metoprolol    Patient Instructions  Medication Instructions:  Please decrease your Metoprolol succinate to 25 mg a day. Continue all other medications as listed.  *If you need a refill on your cardiac medications before your next appointment, please call your pharmacy*  Follow-Up: At [facility], you and your health needs are our priority.  As part of our continuing mission to provide you with exceptional heart care, we have created designated Provider Care Teams.  These Care Teams include your primary Cardiologist (physician) and Advanced Practice Providers (APPs -  Physician Assistants and Nurse Practitioners) who all work together to provide you with the care you need, when you need it.  We recommend signing up for the patient portal called "MyChart".  Sign up information is provided on this After Visit Summary.  MyChart is used to connect with patients for Virtual Visits (Telemedicine).  Patients are able to view lab/test results, encounter notes, upcoming appointments, etc.  Non-urgent messages can be sent to your provider as well.   To learn more about what you can do with MyChart, go to [email or web address].    Your next appointment:   2 month(s)  The format for your next appointment:   In Person  Provider:   Candee Furbish, MD       Signed, Candee Furbish, MD  01/31/2020 12:09 PM    [facility]

## 2020-01-31 NOTE — Patient Instructions (Signed)
Medication Instructions:  Please decrease your Metoprolol succinate to 25 mg a day. Continue all other medications as listed.  *If you need a refill on your cardiac medications before your next appointment, please call your pharmacy*  Follow-Up: At [facility], you and your health needs are our priority.  As part of our continuing mission to provide you with exceptional heart care, we have created designated Provider Care Teams.  These Care Teams include your primary Cardiologist (physician) and Advanced Practice Providers (APPs -  Physician Assistants and Nurse Practitioners) who all work together to provide you with the care you need, when you need it.  We recommend signing up for the patient portal called "MyChart".  Sign up information is provided on this After Visit Summary.  MyChart is used to connect with patients for Virtual Visits (Telemedicine).  Patients are able to view lab/test results, encounter notes, upcoming appointments, etc.  Non-urgent messages can be sent to your provider as well.   To learn more about what you can do with MyChart, go to [email or web address].    Your next appointment:   2 month(s)  The format for your next appointment:   In Person  Provider:   Candee Furbish, MD

## 2020-02-05 DIAGNOSIS — M5459 Other low back pain: Secondary | ICD-10-CM | POA: Diagnosis not present

## 2020-02-05 DIAGNOSIS — M6281 Muscle weakness (generalized): Secondary | ICD-10-CM | POA: Diagnosis not present

## 2020-02-05 DIAGNOSIS — M25559 Pain in unspecified hip: Secondary | ICD-10-CM | POA: Diagnosis not present

## 2020-02-06 DIAGNOSIS — L97529 Non-pressure chronic ulcer of other part of left foot with unspecified severity: Secondary | ICD-10-CM | POA: Diagnosis not present

## 2020-02-08 DIAGNOSIS — M6281 Muscle weakness (generalized): Secondary | ICD-10-CM | POA: Diagnosis not present

## 2020-02-08 DIAGNOSIS — M5459 Other low back pain: Secondary | ICD-10-CM | POA: Diagnosis not present

## 2020-02-08 DIAGNOSIS — M25559 Pain in unspecified hip: Secondary | ICD-10-CM | POA: Diagnosis not present

## 2020-02-20 DIAGNOSIS — M6281 Muscle weakness (generalized): Secondary | ICD-10-CM | POA: Diagnosis not present

## 2020-02-20 DIAGNOSIS — M25559 Pain in unspecified hip: Secondary | ICD-10-CM | POA: Diagnosis not present

## 2020-02-20 DIAGNOSIS — M5459 Other low back pain: Secondary | ICD-10-CM | POA: Diagnosis not present

## 2020-02-27 DIAGNOSIS — M5459 Other low back pain: Secondary | ICD-10-CM | POA: Diagnosis not present

## 2020-02-27 DIAGNOSIS — M25559 Pain in unspecified hip: Secondary | ICD-10-CM | POA: Diagnosis not present

## 2020-02-27 DIAGNOSIS — M6281 Muscle weakness (generalized): Secondary | ICD-10-CM | POA: Diagnosis not present

## 2020-03-03 DIAGNOSIS — M6281 Muscle weakness (generalized): Secondary | ICD-10-CM | POA: Diagnosis not present

## 2020-03-03 DIAGNOSIS — M5459 Other low back pain: Secondary | ICD-10-CM | POA: Diagnosis not present

## 2020-03-03 DIAGNOSIS — M25559 Pain in unspecified hip: Secondary | ICD-10-CM | POA: Diagnosis not present

## 2020-03-12 DIAGNOSIS — M25559 Pain in unspecified hip: Secondary | ICD-10-CM | POA: Diagnosis not present

## 2020-03-12 DIAGNOSIS — M6281 Muscle weakness (generalized): Secondary | ICD-10-CM | POA: Diagnosis not present

## 2020-03-12 DIAGNOSIS — M5459 Other low back pain: Secondary | ICD-10-CM | POA: Diagnosis not present

## 2020-03-18 DIAGNOSIS — M25559 Pain in unspecified hip: Secondary | ICD-10-CM | POA: Diagnosis not present

## 2020-03-18 DIAGNOSIS — M6281 Muscle weakness (generalized): Secondary | ICD-10-CM | POA: Diagnosis not present

## 2020-03-18 DIAGNOSIS — M5459 Other low back pain: Secondary | ICD-10-CM | POA: Diagnosis not present

## 2020-04-01 ENCOUNTER — Ambulatory Visit: Payer: Medicare Other | Admitting: Cardiology

## 2020-04-08 ENCOUNTER — Encounter: Payer: Self-pay | Admitting: Cardiology

## 2020-04-08 ENCOUNTER — Ambulatory Visit (INDEPENDENT_AMBULATORY_CARE_PROVIDER_SITE_OTHER): Payer: Medicare Other | Admitting: Cardiology

## 2020-04-08 ENCOUNTER — Other Ambulatory Visit: Payer: Self-pay

## 2020-04-08 VITALS — BP 146/80 | HR 65 | Ht 69.0 in | Wt 166.0 lb

## 2020-04-08 DIAGNOSIS — Z7901 Long term (current) use of anticoagulants: Secondary | ICD-10-CM | POA: Diagnosis not present

## 2020-04-08 DIAGNOSIS — I4510 Unspecified right bundle-branch block: Secondary | ICD-10-CM | POA: Diagnosis not present

## 2020-04-08 DIAGNOSIS — I2510 Atherosclerotic heart disease of native coronary artery without angina pectoris: Secondary | ICD-10-CM

## 2020-04-08 DIAGNOSIS — I4821 Permanent atrial fibrillation: Secondary | ICD-10-CM

## 2020-04-08 DIAGNOSIS — I2583 Coronary atherosclerosis due to lipid rich plaque: Secondary | ICD-10-CM

## 2020-04-08 MED ORDER — METOPROLOL SUCCINATE ER 25 MG PO TB24: 25.0000 mg | ORAL_TABLET | Freq: Every day | ORAL | 0 refills | Status: AC

## 2020-04-08 NOTE — Progress Notes (Signed)
Cardiology Office Note:    Date:  04/08/2020   ID:  [name], DOB August 23, 1926, MRN [id number]  PCP:  Anthony Huddle, MD  [facility] Cardiologist:  Anthony Furbish, MD  [facility] Electrophysiologist:  None   Referring MD: Anthony Huddle, MD     History of Present Illness:    [name] is a 87 y.o. male here for the follow-up of permanent atrial fibrillation.  Had bradycardia noted at last visit.  EKG previously on 01/31/20 showed 46 bpm with right bundle branch block left anterior fascicular block and atrial fibrillation.  No syncope no chest pain no shortness of breath.  Overall doing well from a cardiac perspective.  Frustrated with his orthopedic issues, falls etc.  Atrial fibrillation was discovered years ago in [place] after a small pericardial effusion was subacute rib fractures after fall.  He was treated with prednisone.  Has been on chronic anticoagulation.  ESR was normal repeat echocardiogram showed no evidence of pericardial effusion.  He has an apartment in [place] [address]  Past Medical History:  Diagnosis Date  . Allergic rhinitis   . Arthritis   . Atrial fibrillation ([place])   . Coronary artery disease    mild, non-obstructive CAD by 2001 cath  . Hearing decreased   . Hypertension   . Hyponatremia   . Impacted cerumen   . Myalgia   . Pericardial effusion    August 2014-repeat echocardiogram 12/11/12-no effusion  . Pleural effusion    August 2014  . Pure hypercholesterolemia   . PVC (premature ventricular contraction)   . RBBB    with left anterior hemiblock  . Renal insufficiency    stage III, creatinine 1.41, June, 2013 - Celebrex reduced  . Spinal stenosis    with back pain - considering neurosurgery with [name], March, 2014  . Tachycardia   . Vitamin D deficiency     Past Surgical History:  Procedure Laterality Date  . CARDIAC CATHETERIZATION      normal left main, LAD with 40-50%  narrowing proximal LAD, diagonal vessel with 20-30% irregular lesion, left circumflex normal, right coronary artery normal, left ventricular function normal - [name]  . CATARACT EXTRACTION    . HERNIA REPAIR    . INNER EAR SURGERY    . JOINT REPLACEMENT Bilateral    hip  . LUMBAR LAMINECTOMY/DECOMPRESSION MICRODISCECTOMY N/A 08/08/2012   Procedure: LUMBAR FOUR TO FIVE, LUMBAR FIVE TO SACRAL ONE LUMBAR DECOMPRESSION;  Surgeon: Anthony Moore, MD;  Location: [facility];  Service: Neurosurgery;  Laterality: N/A;  LUMBAR LAMINECTOMY/DECOMPRESSION MICRODISCECTOMY 2 LEVELS    Current Medications: Current Meds  Medication Sig  . ALPRAZolam (XANAX) 0.25 MG tablet Take 0.25 mg by mouth at bedtime as needed for anxiety.  Marland Kitchen amLODipine (NORVASC) 5 MG tablet Take 5 mg by mouth daily.  . cholecalciferol (VITAMIN D) 1000 UNITS tablet Take 1,000 Units by mouth daily.  . finasteride (PROSCAR) 5 MG tablet Take 5 mg by mouth at bedtime.  . furosemide (LASIX) 20 MG tablet Take 20 mg by mouth as needed.  . hydrochlorothiazide (MICROZIDE) 12.5 MG capsule Take 12.5 mg by mouth daily.  Marland Kitchen losartan (COZAAR) 50 MG tablet Take 50 mg by mouth daily.  . metoprolol succinate (TOPROL XL) 25 MG 24 hr tablet Take 1 tablet (25 mg total) by mouth daily.  . Multiple Vitamins-Minerals (MULTIVITAMIN WITH MINERALS) tablet Take 1 tablet by mouth daily.  Marland Kitchen omeprazole (  PRILOSEC) 20 MG capsule Take 20 mg by mouth daily.  . probenecid (BENEMID) 500 MG tablet Take 250 mg by mouth daily.   . Rivaroxaban (XARELTO) 15 MG TABS tablet Take 1 tablet (15 mg total) by mouth daily with supper.  . rosuvastatin (CRESTOR) 5 MG tablet Take 5 mg by mouth at bedtime.  Marland Kitchen terazosin (HYTRIN) 10 MG capsule Take 10 mg by mouth at bedtime.  . TRAMADOL HCL ER PO Take by mouth as needed.  . zolpidem (AMBIEN) 5 MG tablet Take 5 mg by mouth at bedtime as needed for sleep.  . [DISCONTINUED] metoprolol succinate (TOPROL-XL) 25 MG 24 hr tablet Take 1  tablet (25 mg total) by mouth daily.     Allergies:   Patient has no known allergies.   Social History   Socioeconomic History  . Marital status: Married    Spouse name: Not on file  . Number of children: Not on file  . Years of education: Not on file  . Highest education level: Not on file  Occupational History  . Not on file  Tobacco Use  . Smoking status: Never Smoker  . Smokeless tobacco: Never Used  Substance and Sexual Activity  . Alcohol use: Yes    Alcohol/week: 1.0 standard drink    Types: 1 Standard drinks or equivalent per week  . Drug use: No  . Sexual activity: Not on file  Other Topics Concern  . Not on file  Social History Narrative  . Not on file   Social Determinants of Health   Financial Resource Strain: Not on file  Food Insecurity: Not on file  Transportation Needs: Not on file  Physical Activity: Not on file  Stress: Not on file  Social Connections: Not on file     Family History: The patient's family history includes CAD in his father and mother; Prostate cancer in his father.  ROS:   Please see the history of present illness.    No fevers chills nausea vomiting syncope bleeding.  Slow gait, all other systems reviewed and are negative.  EKGs/Labs/Other Studies Reviewed:    The following studies were reviewed today:  Echo 2016-normal EF  EKG:  EKG is  ordered today.  The ekg ordered today demonstrates A. fib right bundle branch block left anterior fascicular block 65 bpm improved from 46  Recent Labs: No results found for requested labs within last 8760 hours.  Recent Lipid Panel No results found for: CHOL, TRIG, HDL, CHOLHDL, VLDL, LDLCALC, LDLDIRECT    Physical Exam:    VS:  BP (!) 146/80 (BP Location: Left Arm, Patient Position: Sitting, Cuff Size: Normal)   Pulse 65   Ht $R'5\' 9"'JF$  (1.753 m)   Wt 166 lb (75.3 kg)   BMI 24.51 kg/m     Wt Readings from Last 3 Encounters:  04/08/20 166 lb (75.3 kg)  01/31/20 165 lb (74.8 kg)   02/23/18 168 lb (76.2 kg)     GEN:  Well nourished, well developed in no acute distress HEENT: Normal NECK: No JVD; No carotid bruits LYMPHATICS: No lymphadenopathy CARDIAC: Irregular, no murmurs, rubs, gallops RESPIRATORY:  Clear to auscultation without rales, wheezing or rhonchi  ABDOMEN: Soft, non-tender, non-distended MUSCULOSKELETAL:  No edema; No deformity  SKIN: Warm and dry NEUROLOGIC:  Alert and oriented x 3 PSYCHIATRIC:  Normal affect   ASSESSMENT:    1. Permanent atrial fibrillation ([place])   2. Coronary artery disease due to lipid rich plaque   3. Chronic anticoagulation  4. Right bundle branch block    PLAN:    In order of problems listed above:  Permanent atrial fibrillation - I decreased Toprol to 25 mg at last visit because of heart rate of 46 bpm with right bundle branch block.  Chronic anticoagulation - On Xarelto with creatinine clearance below 50.  Coronary artery disease - Nonobstructive.  Cardiac catheterization 20001.  Stress echocardiogram 2017 showed no evidence of ischemia.  Previously had rare shoulder pain associated with swimming but no ischemic symptoms.  Essential hypertension - Cardiologist in [place] switched back to the combination pill of losartan 50 hydrochlorothiazide 12.5.  He is also on amlodipine 5 mg.  Blood pressure today 287 systolic.  I have asked him to check some blood pressures at home to get some more realistic values.  I do not want to overmedicate especially with his recent falls.  Hyperlipidemia - Continue with Crestor-LDL 59.  Creatinine 1.4 TSH 1.2  47-month follow-up       Medication Adjustments/Labs and Tests Ordered: Current medicines are reviewed at length with the patient today.  Concerns regarding medicines are outlined above.  Orders Placed This Encounter  Procedures  . EKG 12-Lead   Meds ordered this encounter  Medications  . metoprolol succinate (TOPROL XL) 25 MG 24 hr tablet    Sig: Take 1 tablet  (25 mg total) by mouth daily.    Dispense:  90 tablet    Refill:  0    Patient Instructions  Medication Instructions:  Your physician recommends that you continue on your current medications as directed. Please refer to the Current Medication list given to you today.   *If you need a refill on your cardiac medications before your next appointment, please call your pharmacy*   Lab Work: none If you have labs (blood work) drawn today and your tests are completely normal, you will receive your results only by: Marland Kitchen MyChart Message (if you have MyChart) OR . A paper copy in the mail If you have any lab test that is abnormal or we need to change your treatment, we will call you to review the results.   Testing/Procedures: none   Follow-Up: At [facility], you and your health needs are our priority.  As part of our continuing mission to provide you with exceptional heart care, we have created designated Provider Care Teams.  These Care Teams include your primary Cardiologist (physician) and Advanced Practice Providers (APPs -  Physician Assistants and Nurse Practitioners) who all work together to provide you with the care you need, when you need it.  We recommend signing up for the patient portal called "MyChart".  Sign up information is provided on this After Visit Summary.  MyChart is used to connect with patients for Virtual Visits (Telemedicine).  Patients are able to view lab/test results, encounter notes, upcoming appointments, etc.  Non-urgent messages can be sent to your provider as well.   To learn more about what you can do with MyChart, go to [email or web address].    Your next appointment:   6 month(s)  The format for your next appointment:   In Person  Provider:   Candee Furbish, MD   Other Instructions      Signed, Anthony Furbish, MD  04/08/2020 10:38 AM    [place]

## 2020-04-08 NOTE — Patient Instructions (Signed)

## 2020-06-15 ENCOUNTER — Other Ambulatory Visit: Payer: Self-pay | Admitting: *Deleted

## 2021-04-09 ENCOUNTER — Ambulatory Visit: Payer: Medicare Other | Admitting: Cardiology

## 2021-07-20 ENCOUNTER — Encounter: Payer: Self-pay | Admitting: Cardiology

## 2021-07-21 ENCOUNTER — Ambulatory Visit: Payer: Medicare Other | Admitting: Cardiology

## 2023-08-20 DEATH — deceased
